# Patient Record
Sex: Male | Born: 1960 | Race: Black or African American | Hispanic: No | Marital: Single | Smoking: Current every day smoker
Health system: Southern US, Community
[De-identification: ages and names within clinical notes are randomized; demographics above are authoritative.]

---

## 2014-09-16 ENCOUNTER — Encounter (HOSPITAL_COMMUNITY): Payer: Self-pay | Admitting: Emergency Medicine

## 2014-09-16 ENCOUNTER — Emergency Department (HOSPITAL_COMMUNITY)
Admission: EM | Admit: 2014-09-16 | Discharge: 2014-09-16 | Payer: Self-pay | Attending: Emergency Medicine | Admitting: Emergency Medicine

## 2014-09-16 DIAGNOSIS — S61205A Unspecified open wound of left ring finger without damage to nail, initial encounter: Secondary | ICD-10-CM | POA: Insufficient documentation

## 2014-09-16 DIAGNOSIS — Z23 Encounter for immunization: Secondary | ICD-10-CM | POA: Insufficient documentation

## 2014-09-16 DIAGNOSIS — S61219A Laceration without foreign body of unspecified finger without damage to nail, initial encounter: Secondary | ICD-10-CM

## 2014-09-16 DIAGNOSIS — Z720 Tobacco use: Secondary | ICD-10-CM | POA: Insufficient documentation

## 2014-09-16 MED ORDER — LIDOCAINE HCL 2 % IJ SOLN
10.0000 mL | Freq: Once | INTRAMUSCULAR | Status: DC
  Filled 2014-09-16: qty 20

## 2014-09-16 MED ORDER — TETANUS-DIPHTH-ACELL PERTUSSIS 5-2.5-18.5 LF-MCG/0.5 IM SUSP
0.5000 mL | Freq: Once | INTRAMUSCULAR | Status: AC
  Administered 2014-09-16: 0.5 mL via INTRAMUSCULAR
  Filled 2014-09-16: qty 0.5
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
## 2014-09-16 NOTE — ED Notes (Signed)
Pt being sutured

## 2014-09-16 NOTE — ED Provider Notes (Signed)
Medical screening examination/treatment/procedure(s) were performed by non-physician practitioner and as supervising physician I was immediately available for consultation/collaboration.   EKG Interpretation None        Layla MawKristen N Ward, DO 09/16/14 [phone]

## 2014-09-16 NOTE — ED Notes (Signed)
PA at bedside.
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
## 2014-09-16 NOTE — Discharge Instructions (Signed)
WOUND CARE °Please have your stitches/staples removed in 8 days or sooner if you have concerns. You may do this at any available urgent care or at your primary care doctor's office. ° Keep area clean and dry for 24 hours. Do not remove °bandage, if applied. ° After 24 hours, remove bandage and wash wound °gently with mild soap and warm water. Reapply °a new bandage after cleaning wound, if directed. ° Continue daily cleansing with soap and water until °stitches/staples are removed. ° Do not apply any ointments or creams to the wound °while stitches/staples are in place, as this may cause °delayed healing. ° Seek medical careif you experience any of the following °signs of infection: Swelling, redness, pus drainage, °streaking, fever >101.0 F ° Seek care if you experience excessive bleeding °that does not stop after 15-20 minutes of constant, firm °pressure. ° ° ° °

## 2019-01-31 ENCOUNTER — Other Ambulatory Visit: Payer: Self-pay

## 2019-01-31 ENCOUNTER — Encounter (HOSPITAL_COMMUNITY): Payer: Self-pay

## 2019-01-31 ENCOUNTER — Inpatient Hospital Stay (HOSPITAL_COMMUNITY)
Admission: AD | Admit: 2019-01-31 | Discharge: 2019-02-05 | DRG: 885 | Disposition: A | Discharge status: Home / Self Care | Payer: Federal, State, Local not specified - Other | Source: Intra-hospital | Attending: Psychiatry | Admitting: Psychiatry

## 2019-01-31 ENCOUNTER — Emergency Department (HOSPITAL_COMMUNITY)
Admission: EM | Admit: 2019-01-31 | Discharge: 2019-01-31 | Disposition: A | Discharge status: Other Acute Inpatient Hospital | Payer: Self-pay | Attending: Emergency Medicine | Admitting: Emergency Medicine

## 2019-01-31 DIAGNOSIS — Y92410 Unspecified street and highway as the place of occurrence of the external cause: Secondary | ICD-10-CM

## 2019-01-31 DIAGNOSIS — Y901 Blood alcohol level of 20-39 mg/100 ml: Secondary | ICD-10-CM | POA: Diagnosis present

## 2019-01-31 DIAGNOSIS — Z789 Other specified health status: Secondary | ICD-10-CM

## 2019-01-31 DIAGNOSIS — Z046 Encounter for general psychiatric examination, requested by authority: Secondary | ICD-10-CM | POA: Insufficient documentation

## 2019-01-31 DIAGNOSIS — G8929 Other chronic pain: Secondary | ICD-10-CM | POA: Diagnosis present

## 2019-01-31 DIAGNOSIS — Z008 Encounter for other general examination: Secondary | ICD-10-CM

## 2019-01-31 DIAGNOSIS — F1410 Cocaine abuse, uncomplicated: Secondary | ICD-10-CM | POA: Diagnosis present

## 2019-01-31 DIAGNOSIS — F1010 Alcohol abuse, uncomplicated: Secondary | ICD-10-CM | POA: Diagnosis present

## 2019-01-31 DIAGNOSIS — F1099 Alcohol use, unspecified with unspecified alcohol-induced disorder: Secondary | ICD-10-CM | POA: Insufficient documentation

## 2019-01-31 DIAGNOSIS — G4700 Insomnia, unspecified: Secondary | ICD-10-CM | POA: Diagnosis present

## 2019-01-31 DIAGNOSIS — F1490 Cocaine use, unspecified, uncomplicated: Secondary | ICD-10-CM | POA: Insufficient documentation

## 2019-01-31 DIAGNOSIS — F1424 Cocaine dependence with cocaine-induced mood disorder: Secondary | ICD-10-CM | POA: Diagnosis not present

## 2019-01-31 DIAGNOSIS — F17210 Nicotine dependence, cigarettes, uncomplicated: Secondary | ICD-10-CM | POA: Diagnosis present

## 2019-01-31 DIAGNOSIS — F1020 Alcohol dependence, uncomplicated: Secondary | ICD-10-CM

## 2019-01-31 DIAGNOSIS — Z7289 Other problems related to lifestyle: Secondary | ICD-10-CM

## 2019-01-31 DIAGNOSIS — R45851 Suicidal ideations: Secondary | ICD-10-CM | POA: Insufficient documentation

## 2019-01-31 DIAGNOSIS — F329 Major depressive disorder, single episode, unspecified: Secondary | ICD-10-CM | POA: Insufficient documentation

## 2019-01-31 DIAGNOSIS — F4310 Post-traumatic stress disorder, unspecified: Secondary | ICD-10-CM | POA: Diagnosis present

## 2019-01-31 DIAGNOSIS — Z720 Tobacco use: Secondary | ICD-10-CM

## 2019-01-31 DIAGNOSIS — R441 Visual hallucinations: Secondary | ICD-10-CM | POA: Diagnosis present

## 2019-01-31 DIAGNOSIS — Z590 Homelessness: Secondary | ICD-10-CM | POA: Diagnosis not present

## 2019-01-31 DIAGNOSIS — F332 Major depressive disorder, recurrent severe without psychotic features: Principal | ICD-10-CM | POA: Diagnosis present

## 2019-01-31 DIAGNOSIS — R030 Elevated blood-pressure reading, without diagnosis of hypertension: Secondary | ICD-10-CM | POA: Insufficient documentation

## 2019-01-31 DIAGNOSIS — F17200 Nicotine dependence, unspecified, uncomplicated: Secondary | ICD-10-CM | POA: Insufficient documentation

## 2019-01-31 LAB — RAPID URINE DRUG SCREEN, HOSP PERFORMED
Amphetamines: NOT DETECTED
Barbiturates: NOT DETECTED
Benzodiazepines: NOT DETECTED
COCAINE: POSITIVE — AB
OPIATES: NOT DETECTED
TETRAHYDROCANNABINOL: NOT DETECTED

## 2019-01-31 LAB — COMPREHENSIVE METABOLIC PANEL
ALBUMIN: 4.1 g/dL (ref 3.5–5.0)
ALT: 22 U/L (ref 0–44)
ANION GAP: 14 (ref 5–15)
AST: 21 U/L (ref 15–41)
Alkaline Phosphatase: 72 U/L (ref 38–126)
BUN: 14 mg/dL (ref 6–20)
CO2: 22 mmol/L (ref 22–32)
Calcium: 8.9 mg/dL (ref 8.9–10.3)
Chloride: 100 mmol/L (ref 98–111)
Creatinine, Ser: 1.15 mg/dL (ref 0.61–1.24)
GFR calc Af Amer: >60 mL/min (ref >60)
GFR calc non Af Amer: >60 mL/min (ref >60)
GLUCOSE: 79 mg/dL (ref 70–99)
POTASSIUM: 3.8 mmol/L (ref 3.5–5.1)
Sodium: 136 mmol/L (ref 135–145)
Total Bilirubin: 0.8 mg/dL (ref 0.3–1.2)
Total Protein: 7.2 g/dL (ref 6.5–8.1)

## 2019-01-31 LAB — ETHANOL: Alcohol, Ethyl (B): 37 mg/dL — ABNORMAL HIGH (ref <10)

## 2019-01-31 LAB — CBC
HEMATOCRIT: 45.2 % (ref 39.0–52.0)
HEMOGLOBIN: 14.5 g/dL (ref 13.0–17.0)
MCH: 29.3 pg (ref 26.0–34.0)
MCHC: 32.1 g/dL (ref 30.0–36.0)
MCV: 91.3 fL (ref 80.0–100.0)
Platelets: 222 10*3/uL (ref 150–400)
RBC: 4.95 MIL/uL (ref 4.22–5.81)
RDW: 14.9 % (ref 11.5–15.5)
WBC: 11 10*3/uL — AB (ref 4.0–10.5)
nRBC: 0 % (ref 0.0–0.2)

## 2019-01-31 LAB — SALICYLATE LEVEL: Salicylate Lvl: <7 mg/dL (ref 2.8–30.0)

## 2019-01-31 LAB — ACETAMINOPHEN LEVEL

## 2019-01-31 MED ORDER — LORAZEPAM 1 MG PO TABS: 0.0000 mg | ORAL_TABLET | Freq: Two times a day (BID) | ORAL | Status: DC

## 2019-01-31 MED ORDER — NICOTINE 21 MG/24HR TD PT24: 21.0000 mg | MEDICATED_PATCH | Freq: Every day | TRANSDERMAL | Status: DC

## 2019-01-31 MED ORDER — ZOLPIDEM TARTRATE 5 MG PO TABS: 5.0000 mg | ORAL_TABLET | Freq: Every evening | ORAL | Status: DC | PRN

## 2019-01-31 MED ORDER — ACETAMINOPHEN 325 MG PO TABS: 650.0000 mg | ORAL_TABLET | ORAL | Status: DC | PRN

## 2019-01-31 MED ORDER — ALUM & MAG HYDROXIDE-SIMETH 200-200-20 MG/5ML PO SUSP: 30.0000 mL | Freq: Four times a day (QID) | ORAL | Status: DC | PRN

## 2019-01-31 MED ORDER — VITAMIN B-1 100 MG PO TABS: 100.0000 mg | ORAL_TABLET | Freq: Every day | ORAL | Status: DC

## 2019-01-31 MED ORDER — LORAZEPAM 2 MG/ML IJ SOLN: 0.0000 mg | Freq: Four times a day (QID) | INTRAMUSCULAR | Status: DC

## 2019-01-31 MED ORDER — LORAZEPAM 2 MG/ML IJ SOLN: 0.0000 mg | Freq: Two times a day (BID) | INTRAMUSCULAR | Status: DC

## 2019-01-31 MED ORDER — ONDANSETRON HCL 4 MG PO TABS: 4.0000 mg | ORAL_TABLET | Freq: Three times a day (TID) | ORAL | Status: DC | PRN

## 2019-01-31 MED ORDER — LORAZEPAM 1 MG PO TABS: 0.0000 mg | ORAL_TABLET | Freq: Four times a day (QID) | ORAL | Status: DC

## 2019-01-31 MED ORDER — THIAMINE HCL 100 MG/ML IJ SOLN: 100.0000 mg | Freq: Every day | INTRAMUSCULAR | Status: DC

## 2019-01-31 NOTE — ED Triage Notes (Signed)
Pt reports increased stress over the past few weeks. Pt reports a relapse of cocaine a few weeks ago. He states that today he was sitting on the railroad tracks debating to jump in front of it when the trained passed. Pt is not medicated. Pt calm and cooperative. Arrives with sister.

## 2019-01-31 NOTE — ED Notes (Signed)
Bed: [facility] Expected date:  Expected time:  Means of arrival:  Comments: 
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
## 2019-01-31 NOTE — ED Provider Notes (Signed)
[facility]-EMERGENCY DEPT Provider Note   CSN: [id number] Arrival date & time: 01/31/19  1658    History   Chief Complaint Chief Complaint  Patient presents with  . Suicidal    HPI    Max Gomez is a 58 y.o. male with a PMHx of high blood pressure without formal diagnosis of HTN, who presents to the ED with complaints of suicidal ideations and increased stress.  Patient states that he has been stressed for the last several months, but today it has been worse.  He states that today he started having suicidal ideations with a plan to jump in front of a train.  He also endorses occasional visual hallucinations seeing shadows.  He endorses drinking alcohol, states that he has a sixpack of beer a day, drank a 40 ounce of beer today.  He endorses using cocaine about 4 to 5 days ago.  He also endorses being a tobacco user.  He is not currently on any medications.  He denies HI or auditory hallucinations.  He has been told in the past that he has high blood pressure but he has not been put on medications.  He has no medical complaints at this time and is here voluntarily.  The history is provided by the patient and medical records. No language interpreter was used.    History reviewed. No pertinent past medical history.  There are no active problems to display for this patient.   History reviewed. No pertinent surgical history.      Home Medications    Prior to Admission medications   Not on File    Family History Family History  Problem Relation Age of Onset  . Cancer Other     Social History Social History   Tobacco Use  . Smoking status: Current Every Day Smoker  Substance Use Topics  . Alcohol use: Yes  . Drug use: No     Allergies   Patient has no known allergies.   Review of Systems Review of Systems  Constitutional: Negative for chills and fever.  Respiratory: Negative for shortness of breath.   Cardiovascular: Negative for chest  pain.  Gastrointestinal: Negative for abdominal pain, constipation, diarrhea, nausea and vomiting.  Genitourinary: Negative for dysuria and hematuria.  Musculoskeletal: Negative for arthralgias and myalgias.  Skin: Negative for color change.  Allergic/Immunologic: Negative for immunocompromised state.  Neurological: Negative for weakness and numbness.  Psychiatric/Behavioral: Positive for hallucinations and suicidal ideas. Negative for confusion.   All other systems reviewed and are negative for acute change except as noted in the HPI.    Physical Exam Updated Vital Signs BP (!) 150/86   Pulse (!) 101   Temp 98.3 F (36.8 C) (Oral)   Resp 16   SpO2 100%    Physical Exam Vitals signs and nursing note reviewed.  Constitutional:      General: He is not in acute distress.    Appearance: Normal appearance. He is well-developed. He is not toxic-appearing.     Comments: Afebrile, nontoxic, NAD  HENT:     Head: Normocephalic and atraumatic.  Eyes:     General:        Right eye: No discharge.        Left eye: No discharge.     Conjunctiva/sclera: Conjunctivae normal.  Neck:     Musculoskeletal: Normal range of motion and neck supple.  Cardiovascular:     Rate and Rhythm: Normal rate and regular rhythm.     Pulses:  Normal pulses.     Heart sounds: Normal heart sounds, S1 normal and S2 normal. No murmur. No friction rub. No gallop.      Comments: No tachycardia on exam Pulmonary:     Effort: Pulmonary effort is normal. No respiratory distress.     Breath sounds: Normal breath sounds. No decreased breath sounds, wheezing, rhonchi or rales.  Abdominal:     General: Bowel sounds are normal. There is no distension.     Palpations: Abdomen is soft. Abdomen is not rigid.     Tenderness: There is no abdominal tenderness. There is no right CVA tenderness, left CVA tenderness, guarding or rebound. Negative signs include Murphy's sign and McBurney's sign.  Musculoskeletal: Normal range of  motion.  Skin:    General: Skin is warm and dry.     Findings: No rash.  Neurological:     Mental Status: He is alert and oriented to person, place, and time.     Sensory: Sensation is intact. No sensory deficit.     Motor: Motor function is intact.  Psychiatric:        Attention and Perception: He perceives visual hallucinations. He does not perceive auditory hallucinations.        Mood and Affect: Affect normal. Mood is depressed.        Behavior: Behavior normal.        Thought Content: Thought content includes suicidal ideation. Thought content does not include homicidal ideation. Thought content includes suicidal plan. Thought content does not include homicidal plan.     Comments: Depressed mood/affect, but pleasant and cooperative. Endorsing SI with a plan, denies HI, reports visual hallucinations without auditory hallucinations, doesn't seem to be responding to internal stimuli.       ED Treatments / Results  Labs (all labs ordered are listed, but only abnormal results are displayed) Labs Reviewed  ETHANOL - Abnormal; Notable for the following components:      Result Value   Alcohol, Ethyl (B) 37 (*)    All other components within normal limits  ACETAMINOPHEN LEVEL - Abnormal; Notable for the following components:   Acetaminophen (Tylenol), Serum <10 (*)    All other components within normal limits  CBC - Abnormal; Notable for the following components:   WBC 11.0 (*)    All other components within normal limits  RAPID URINE DRUG SCREEN, HOSP PERFORMED - Abnormal; Notable for the following components:   Cocaine POSITIVE (*)    All other components within normal limits  COMPREHENSIVE METABOLIC PANEL  SALICYLATE LEVEL    EKG None  Radiology No results found.  Procedures Procedures (including critical care time)  Medications Ordered in ED Medications  acetaminophen (TYLENOL) tablet 650 mg (has no administration in time range)  zolpidem (AMBIEN) tablet 5 mg (has  no administration in time range)  ondansetron (ZOFRAN) tablet 4 mg (has no administration in time range)  alum & mag hydroxide-simeth (MAALOX/MYLANTA) 200-200-20 MG/5ML suspension 30 mL (has no administration in time range)  nicotine (NICODERM CQ - dosed in mg/24 hours) patch 21 mg (has no administration in time range)  LORazepam (ATIVAN) injection 0-4 mg (has no administration in time range)    Or  LORazepam (ATIVAN) tablet 0-4 mg (has no administration in time range)  LORazepam (ATIVAN) injection 0-4 mg (has no administration in time range)    Or  LORazepam (ATIVAN) tablet 0-4 mg (has no administration in time range)  thiamine (VITAMIN B-1) tablet 100 mg (has no administration in time range)  Or  thiamine (B-1) injection 100 mg (has no administration in time range)     Initial Impression / Assessment and Plan / ED Course  I have reviewed the triage vital signs and the nursing notes.  Pertinent labs & imaging results that were available during my care of the patient were reviewed by me and considered in my medical decision making (see chart for details).        58 y.o. male here with suicidal ideations with a plan to jump in front of a train.  Also endorses visual hallucinations seeing shadows.  No HI or auditory hallucinations.  Endorses drinking alcohol regularly, including a 40 ounce beer today.  Uses cocaine, last use 4 to 5 days ago.  Tobacco user as well.  Cessation of all of these were advised.  He is not currently on any medications.  Has been told in the past that he has high blood pressure, but is not on anything.  On exam, BP 150/86, appears depressed, but otherwise physical exam benign.  We will get psych clearance labs and TTS consult.  Will reassess shortly.  7:48 PM CBC with marginally elevated WBC 11.0 but otherwise WNL, doubt clinical significance. CMP WNL. EtOH level 37. Salicylate and acetaminophen levels WNL. UDS +cocaine. Pt medically cleared at this time. Psych  hold orders and CIWA med orders placed. Please see TTS notes for further documentation of care/dispo. PLEASE NOTE THAT PT IS HERE VOLUNTARILY AT THIS TIME, IF PT TRIES TO LEAVE THEY WOULD NEED IVC PAPERWORK TAKEN OUT. Pt stable at time of med clearance.     Final Clinical Impressions(s) / ED Diagnoses   Final diagnoses:  Suicidal ideation  Visual hallucinations  Alcohol use  Tobacco use  Cocaine use  Medical clearance for psychiatric admission    ED Discharge Orders    [address] [place], [place] 01/31/19 1949    Mancel Bale, MD 01/31/19 [phone]

## 2019-01-31 NOTE — Progress Notes (Signed)
Pt accepted to [facility] at 23:00 to 304-2 to Dr. Jama Flavors, MD. Call to report [phone]. Jari Favre, RN has been advised.  VOL consent for treatment signed and faxed to [facility] by previous TTS counselor.   Princess Bruins, MSW, LCSW Therapeutic Triage Specialist  [phone]

## 2019-01-31 NOTE — ED Notes (Signed)
Bed: WA26 Expected date:  Expected time:  Means of arrival:  Comments: TR4 

## 2019-02-01 ENCOUNTER — Encounter (HOSPITAL_COMMUNITY): Payer: Self-pay

## 2019-02-01 DIAGNOSIS — F1424 Cocaine dependence with cocaine-induced mood disorder: Secondary | ICD-10-CM

## 2019-02-01 DIAGNOSIS — F332 Major depressive disorder, recurrent severe without psychotic features: Secondary | ICD-10-CM | POA: Diagnosis present

## 2019-02-01 LAB — HEMOGLOBIN A1C
Hgb A1c MFr Bld: 5.7 % — ABNORMAL HIGH (ref 4.8–5.6)
MEAN PLASMA GLUCOSE: 116.89 mg/dL

## 2019-02-01 LAB — LIPID PANEL
CHOLESTEROL: 238 mg/dL — AB (ref 0–200)
HDL: 90 mg/dL (ref >40)
LDL Cholesterol: 131 mg/dL — ABNORMAL HIGH (ref 0–99)
Total CHOL/HDL Ratio: 2.6 RATIO
Triglycerides: 84 mg/dL (ref <150)
VLDL: 17 mg/dL (ref 0–40)

## 2019-02-01 LAB — TSH: TSH: 4.315 u[IU]/mL (ref 0.350–4.500)

## 2019-02-01 MED ORDER — ONDANSETRON 4 MG PO TBDP: 4.0000 mg | ORAL_TABLET | Freq: Four times a day (QID) | ORAL | Status: DC | PRN

## 2019-02-01 MED ORDER — LOPERAMIDE HCL 2 MG PO CAPS: 2.0000 mg | ORAL_CAPSULE | ORAL | Status: AC | PRN

## 2019-02-01 MED ORDER — HYDROXYZINE HCL 25 MG PO TABS: 25.0000 mg | ORAL_TABLET | Freq: Three times a day (TID) | ORAL | Status: DC | PRN

## 2019-02-01 MED ORDER — TRAZODONE HCL 50 MG PO TABS
50.0000 mg | ORAL_TABLET | Freq: Every evening | ORAL | Status: DC | PRN
  Administered 2019-02-02 – 2019-02-04 (×3): 50 mg via ORAL
  Filled 2019-02-01: qty 7
  Filled 2019-02-01 (×4): qty 1

## 2019-02-01 MED ORDER — ADULT MULTIVITAMIN W/MINERALS CH
1.0000 | ORAL_TABLET | Freq: Every day | ORAL | Status: DC
  Administered 2019-02-01 – 2019-02-05 (×5): 1 via ORAL
  Filled 2019-02-01 (×8): qty 1

## 2019-02-01 MED ORDER — ARIPIPRAZOLE 2 MG PO TABS
2.0000 mg | ORAL_TABLET | Freq: Every day | ORAL | Status: DC
  Administered 2019-02-01 – 2019-02-05 (×5): 2 mg via ORAL
  Filled 2019-02-01 (×4): qty 1
  Filled 2019-02-01: qty 7
  Filled 2019-02-01 (×3): qty 1
  Filled 2019-02-01: qty 7
  Filled 2019-02-01: qty 1

## 2019-02-01 MED ORDER — NICOTINE 21 MG/24HR TD PT24
21.0000 mg | MEDICATED_PATCH | Freq: Every day | TRANSDERMAL | Status: DC
  Administered 2019-02-05: 21 mg via TRANSDERMAL
  Filled 2019-02-01 (×6): qty 1

## 2019-02-01 MED ORDER — ALUM & MAG HYDROXIDE-SIMETH 200-200-20 MG/5ML PO SUSP: 30.0000 mL | ORAL | Status: DC | PRN

## 2019-02-01 MED ORDER — SERTRALINE HCL 50 MG PO TABS
50.0000 mg | ORAL_TABLET | Freq: Every day | ORAL | Status: DC
  Administered 2019-02-01 – 2019-02-05 (×5): 50 mg via ORAL
  Filled 2019-02-01: qty 7
  Filled 2019-02-01 (×8): qty 1
  Filled 2019-02-01: qty 7

## 2019-02-01 MED ORDER — MAGNESIUM HYDROXIDE 400 MG/5ML PO SUSP: 30.0000 mL | Freq: Every day | ORAL | Status: DC | PRN

## 2019-02-01 MED ORDER — CHLORDIAZEPOXIDE HCL 25 MG PO CAPS: 25.0000 mg | ORAL_CAPSULE | Freq: Four times a day (QID) | ORAL | Status: DC | PRN

## 2019-02-01 MED ORDER — ACETAMINOPHEN 325 MG PO TABS: 650.0000 mg | ORAL_TABLET | Freq: Four times a day (QID) | ORAL | Status: DC | PRN

## 2019-02-01 MED ORDER — ONDANSETRON 4 MG PO TBDP: 4.0000 mg | ORAL_TABLET | Freq: Four times a day (QID) | ORAL | Status: AC | PRN

## 2019-02-01 MED ORDER — VITAMIN B-1 100 MG PO TABS
100.0000 mg | ORAL_TABLET | Freq: Every day | ORAL | Status: DC
  Administered 2019-02-02 – 2019-02-05 (×4): 100 mg via ORAL
  Filled 2019-02-01 (×6): qty 1

## 2019-02-01 MED ORDER — LOPERAMIDE HCL 2 MG PO CAPS: 2.0000 mg | ORAL_CAPSULE | ORAL | Status: DC | PRN

## 2019-02-01 MED ORDER — HYDROXYZINE HCL 25 MG PO TABS
25.0000 mg | ORAL_TABLET | Freq: Four times a day (QID) | ORAL | Status: AC | PRN
  Administered 2019-02-02: 25 mg via ORAL
  Filled 2019-02-01: qty 1

## 2019-02-01 NOTE — Progress Notes (Signed)
CSW met with patient to discuss aftercare referrals. Patient stated he left his job because he planned to enter a long term treatment facility.  Patient has several upcoming court dates. [facility] 03/12 and 03/23. [facility] in 03/23.   CSW able to refer patient to [facility], CSW discussed the likeliness of the patient being able to discharge straight to [facility] being unlikely. Patient anxious, but appropriate. CSW provided patient a list of [facility] as a back up.  Stephanie Acre, LCSW-A Clinical Social Worker
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
## 2019-02-01 NOTE — Progress Notes (Signed)
Patient attended grief and loss group facilitated by Burnis Kingfisher, Mdiv, [facility], and Ethel Rana, MS, [facility], [facility].   Group focuses on change and loss experienced by group members; topics include loss due to death, loss of relationships, loss of a place, loss of sense of self, etc. Group members are invited to share the changes and losses that are currently affecting their lives. Facilitators tie together shared experiences between group members and validate emotions felt by participants.  Pt Max Gomez attended and participated in group discussion. Pt acknowledged that grief can be difficult and can come in ways we don't expect. Pt reported he is trying to hold onto perspective that in intense moments, he can become overwhelmed and struggle to find purpose in life, but that due to his faith he's been able to hold onto hope during difficult times. Pt reported he tries to be good to others and tries to control what he can, as opposed to holding grudges. Pt acknowledged his time being incarcerated, the guilt he feels about not being there for his mother due to his incarceration, and how leaving prison he returned to substance use. He reported being confused by how he could return to use after 10 years of sobriety in prison. He praised other group members for admitting to their problems, whereas he reports he lies about his use in order to protect others. Pt appeared unaware of group norms, as evident by speaking over others, but over time in group he began listening to other group members and reflecting on what they shared.   Ethel Rana, MS, [facility], [facility]
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
## 2019-02-01 NOTE — Progress Notes (Addendum)
D:  Patient's self inventory sheet, patient has fair sleep, no sleep medication.  Good appetite, low energy level, poor concentration.  Rated depression 8, hopeless 5, anxiety 7.  Physical problems, lightheaded, headaches.  Denied physical pain.  Goal is work on depression.  Plans to think.  No discharge plans. A:  No medications scheduled for patient today, and patient stated he did not need any medications.  Emotional support and encouragement given patient. R:  Denied SI and HI, contracts for safety.  Denied A/V hallucinations.  Safety maintained with 15 minute checks. Patient stated he has had a very hard life.  Does not understand why bad things keep happening to him.

## 2019-02-01 NOTE — [facility] Suicide Risk Assessment (Signed)
[facility] INPATIENT:  Family/Significant Other Suicide Prevention Education  Suicide Prevention Education:  Contact Attempts: Max Gomez, Niece, [phone]  has been identified by the patient as the family member/significant other with whom the patient will be residing, and identified as the person(s) who will aid the patient in the event of a mental health crisis.  With written consent from the patient, two attempts were made to provide suicide prevention education, prior to and/or following the patient's discharge.  We were unsuccessful in providing suicide prevention education.  A suicide education pamphlet was given to the patient to share with family/significant other.  Date and time of first attempt: 02/01/2019 at 3:54pm, Left a HIPAA compliant VM.  Date and time of second attempt: To be attempted at a later time.  Max Gomez 02/01/2019, 3:55 PM

## 2019-02-01 NOTE — [facility] Group Notes (Signed)
LCSW Group Therapy Notes 02/01/2019 2:48 PM  Type of Therapy and Topic: Group Therapy: Overcoming Obstacles  Participation Level: Active  Description of Group:  In this group patients will be encouraged to explore what they see as obstacles to their own wellness and recovery. They will be guided to discuss their thoughts, feelings, and behaviors related to these obstacles. The group will process together ways to cope with barriers, with attention given to specific choices patients can make. Each patient will be challenged to identify changes they are motivated to make in order to overcome their obstacles. This group will be process-oriented, with patients participating in exploration of their own experiences as well as giving and receiving support and challenge from other group members.  Therapeutic Goals: 1. Patient will identify personal and current obstacles as they relate to admission. 2. Patient will identify barriers that currently interfere with their wellness or overcoming obstacles.  3. Patient will identify feelings, thought process and behaviors related to these barriers. 4. Patient will identify two changes they are willing to make to overcome these obstacles:   Summary of Patient Progress Patient stated suicidal thoughts and alcohol use are obstacles he experienced prior to admission. Patient discussed that trying to get into long term residential treatment is a current obstacle he is facing. He reports he feels depressed and anxious knowing he may not be able to discharge directly to a residential treatment facility. He shares he feels like he has always cared more for others than he cares for himself and he has experienced a significant amount of grief and loss in the last few years.    Therapeutic Modalities:  Cognitive Behavioral Therapy Solution Focused Therapy Motivational Interviewing Relapse Prevention Therapy  Enid Cutter, MSW, [facility] 02/01/2019 2:48 PM
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
## 2019-02-01 NOTE — Progress Notes (Signed)
Recreation Therapy Notes  Date:  2. 24. 20 Time: 0930 Location: 300 [address] Dayroom  Group Topic: Stress Management  Goal Area(s) Addresses:  Patient will identify positive stress management techniques. Patient will identify benefits of using stress management post d/c.  Intervention: Worksheet  Activity :  Choice Meditation.  LRT introduced the stress management technique of meditation.  LRT played a meditation that focused on having a choice in changing the way they think about things.  Patients were to follow along as meditation played to engage in activity.  Education:  Stress Management, Discharge Planning.   Education Outcome: Acknowledges Education  Clinical Observations/Feedback: Pt did not attend group.     Jadyn Brasher, LRT/CTRS         Latreece Mochizuki A 02/01/2019 10:54 AM 

## 2019-02-01 NOTE — Plan of Care (Signed)
Nurse discussed anxiety, depression and coping skills with patient.  

## 2019-02-01 NOTE — Progress Notes (Signed)
Pt invited, but declined AA group this evening.  
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
## 2019-02-02 MED ORDER — GABAPENTIN 100 MG PO CAPS
200.0000 mg | ORAL_CAPSULE | Freq: Three times a day (TID) | ORAL | Status: DC
  Administered 2019-02-02 – 2019-02-05 (×10): 200 mg via ORAL
  Filled 2019-02-02: qty 2
  Filled 2019-02-02: qty 42
  Filled 2019-02-02 (×2): qty 2
  Filled 2019-02-02: qty 42
  Filled 2019-02-02 (×4): qty 2
  Filled 2019-02-02 (×3): qty 42
  Filled 2019-02-02: qty 2
  Filled 2019-02-02: qty 42
  Filled 2019-02-02 (×4): qty 2

## 2019-02-02 NOTE — BHH Group Notes (Signed)
[facility] Group Notes: Nursing Psychoeducation  Date:  02/02/2019  Time:  4:00 PM  Type of Therapy:  Psychoeducational Skills  Participation Level:  Did Not Attend  Summary of Progress/Problems: Patient was invited but declined to attend group.  Durwin Davisson A Delbert Darley 02/02/2019, 5:00 PM 

## 2019-02-02 NOTE — Plan of Care (Signed)
  Problem: Activity: Goal: Interest or engagement in leisure activities will improve Outcome: Progressing Goal: Imbalance in normal sleep/wake cycle will improve Outcome: Progressing   

## 2019-02-02 NOTE — Progress Notes (Signed)
Pt attend wrap up group. 

## 2019-02-02 NOTE — Progress Notes (Signed)
D: Pt was in dayroom upon initial approach.  Pt presents with depressed affect and mood.  He reports his day has been "all right, been relaxing, sleeping."  His goal is to "not think about the things I was thinking about the day I came in here."  Pt denies SI/HI, denies hallucinations, denies pain.  Pt has been visible in milieu interacting with peers and staff appropriately.  Pt attended evening group.    A: Introduced self to pt.  Met with pt 1:1.  Actively listened to pt and offered support and encouragement.  PRN medication administered for sleep.  Q15 minute safety checks maintained.  R: Pt is safe on the unit.  Pt is compliant with medications.  Pt verbally contracts for safety.  Will continue to monitor and assess.

## 2019-02-02 NOTE — Progress Notes (Signed)
Recreation Therapy Notes  Animal-Assisted Activity (AAA) Program Checklist/Progress Notes Patient Eligibility Criteria Checklist & Daily Group note for Rec Tx Intervention  Date: 2.25.20 Time: 1430 Location: 400 Hall Dayroom   AAA/T Program Assumption of Risk Form signed by Patient/ or Parent Legal Guardian  YES   Patient is free of allergies or sever asthma  YES   Patient reports no fear of animals  YES   Patient reports no history of cruelty to animals  YES   Patient understands his/her participation is voluntary  YES   Patient washes hands before animal contact  YES   Patient washes hands after animal contact  YES   Behavioral Response:  Engaged  Education: Hand Washing, Appropriate Animal Interaction   Education Outcome: Acknowledges understanding/In group clarification offered/Needs additional education.   Clinical Observations/Feedback:  Pt attended and participated in activity.    Max Gomez, LRT/CTRS         Max Gomez 02/02/2019 3:09 PM 

## 2019-02-02 NOTE — Progress Notes (Signed)
D:  Patient's self inventory sheet, patient has fair sleep, no sleep medication.  Good appetite, low energy level, good concentration.  Rated depression and anxiety 5, hopeless 4.  Denied withdrawals.  Denied SI.  Physical problems, lightheaded, headaches.  Denied physical pain.  Goal is stay hopefull.  Plans to have better thoughts.  No discharge plans. A:  Medications administered per MD orders.  Emotional support and encouragement given patient. R:  Denied SI and HI, contracts for safety.  Denied A/V hallucinations.  Safety maintained with 15 minute checks.
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
## 2019-02-02 NOTE — [facility] Suicide Risk Assessment (Signed)
[facility] INPATIENT:  Family/Significant Other Suicide Prevention Education  Suicide Prevention Education:  Contact Attempts: Peterjohn Stavig, Niece, [phone]  has been identified by the patient as the family member/significant other with whom the patient will be residing, and identified as the person(s) who will aid the patient in the event of a mental health crisis.  With written consent from the patient, two attempts were made to provide suicide prevention education, prior to and/or following the patient's discharge.  We were unsuccessful in providing suicide prevention education.  A suicide education pamphlet was given to the patient to share with family/significant other.  Date and time of first attempt: 02/01/2019 at 3:54pm, Left a HIPAA compliant VM.  Date and time of second attempt: 01/13/2019 at 11:15am.  Darreld Mclean 02/02/2019, 11:17 AM

## 2019-02-02 NOTE — Progress Notes (Signed)
CSW met with patient on unit to discuss referrals and discharge planning. Patient reports that he feels slightly better and has a more stabilized mood with medication, but he feels a bit drowsy. CSW encouraged patient to share his concerns his psychiatrist.  Patient has been referred to [facility]. He was upcoming court dates in March related to driving with a suspended license. These court dates present a barrier to the patient being referred to other residential treatment providers.  CSW presented patient with a list of [employer] yesterday, patient reports he has not followed up with any yet as he spoke with his sister, and she is reportedly working on securing temporary housing for the patient.  Patient agreeable to outpatient follow up for medication management; he prefers [facility] as his provider. He will use their walk in hours after discharge.  Stephanie Acre, LCSW-A Clinical Social Worker

## 2019-02-02 NOTE — Plan of Care (Signed)
Nurse discussed anxiety, depression, coping skills with patient. 

## 2019-02-02 NOTE — [facility] Group Notes (Signed)
[facility] Group Therapy 02/02/2019 3:34 PM  Type of Therapy: Mental Health Association Presentation  Participation Level: Active  Participation Quality: Attentive  Affect: Appropriate  Cognitive: Oriented  Insight: Developing/Improving  Engagement in Therapy: Engaged  Modes of Intervention: Discussion, Education and Socialization   Summary of Progress/Problems: Mental Health Association (MHA) Speaker came to talk about his personal journey with mental health. The pt processed ways by which to relate to the speaker. MHA speaker provided handouts and educational information pertaining to groups and services offered by the MHA. Pt was engaged in speaker's presentation and was receptive to resources provided.   [name], MSW, LCSWA 02/02/2019 3:34 PM 
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
## 2019-02-02 NOTE — Progress Notes (Signed)
D:  Max Gomez was in his room all shift.  RN approached him while resting in his bed for assessment.  He denied SI/HI or A/V hallucinations.  "I'm just tired and I don't want to go to group tonight."  He denied any pain or discomfort and appeared to be in no physical distress.  He denied any withdrawal symptoms.  He was irritable later when tech came to get his vital signs, which he refused.  He did not request prn medications at bedtime.  He is currently resting with his eyes closed and appears to be asleep. A:  1:1 with RN for support and encouragement.  Medications as ordered.  Q 15 minute checks maintained for safety.  Encouraged participation in group and unit activities.   R:  Max Gomez remains safe on the unit.  We will continue to monitor the progress towards his goals.

## 2019-02-03 NOTE — Plan of Care (Addendum)
Patient self inventory- Patient slept dair last night, sleep medication was requested and was helpful. Appetite is good, energy level normal, concentration good. Depression, hopelessness, and anxiety rated 6, 3, 3, out of 10. Denies withdrawal symptoms. Denies SI HI AVH. Denies physical pain. Patient's goal is to "open up, let things out." Patient is compliant with medications prescribed per provider. No side effects noted. Safety is maintained with 15 minute checks as well as environmental checks. Will continue to monitor and provide support.  Problem: Education: Goal: Understanding of discharge needs will improve Outcome: Progressing   Problem: Health Behavior/Discharge Planning: Goal: Ability to identify changes in lifestyle to reduce recurrence of condition will improve Outcome: Progressing Goal: Identification of resources available to assist in meeting health care needs will improve Outcome: Progressing   Problem: Education: Goal: Knowledge of [facility] Education information/materials will improve Outcome: Progressing

## 2019-02-03 NOTE — Progress Notes (Signed)
CSW met with patient on unit to discuss discharge planning with patient. Patient's [facility] referral has been accepted, but there is not an intake date or projected date at this time; it would likely be at least one week out. Patient reports he will follow up with [facility] during their walk in hours.  He has not followed up with [facility], as he hopes to stay with his sister. He is going to talk to his sister this evening. CSW asked patient to prepare for discharge Friday and to reach out to family supports this evening. Patient agreeable.   Stephanie Acre, LCSW-A Clinical Social Worker
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
## 2019-02-03 NOTE — Progress Notes (Signed)
The patient expressed in group that he had a "pretty fair" day. He verbalized that playing kickball helped to distract him from his problems. He states that he is feeling better in that he is feeling "more focused" and that he understands that he needs to move forward and continue working on his issues. His goal for tomorrow is to discuss his discharge plans with the staff.

## 2019-02-03 NOTE — Progress Notes (Signed)
D: Pt was in dayroom upon initial approach.  Pt presents with depressed affect and mood.  He reports his day was "all right."  His goal is to "not be depressed and stressed" and he reports he is feeling "all right."  Pt denies SI/HI, denies hallucinations, denies pain.  Pt has been visible in milieu interacting with peers and staff appropriately.  Pt seen smiling at times and laughing.  Pt attended evening group.    A: Introduced self to pt.  Met with pt 1:1.  Actively listened to pt and offered support and encouragement.  PRN medication administered for sleep.  Q15 minute safety checks maintained.  R: Pt is safe on the unit.  Pt is compliant with medication.  Pt verbally contracts for safety.  Will continue to monitor and assess.

## 2019-02-03 NOTE — Progress Notes (Signed)
Recreation Therapy Notes  Date:  2.26.20 Time: 0930 Location: [address] Dayroom  Group Topic: Stress Management  Goal Area(s) Addresses:  Patient will identify positive stress management techniques. Patient will identify benefits of using stress management post d/c.  Intervention:  Stress Management    Activity :  Guided Imagery.  LRT introduced the stress management technique of guided imagery.  LRT read a script that focused on relaxing under the summer clouds.  Patients were to listen in order to engage as script was read.    Education:  Stress Management, Discharge Planning.   Education Outcome: Acknowledges Education  Clinical Observations/Feedback:  Pt did not attend group.     Caroll Rancher,  LRT/CTRS         Caroll Rancher A 02/03/2019 11:31 AM

## 2019-02-03 NOTE — Therapy (Signed)
Occupational Therapy Group Note  Date:  02/03/2019 Time:  11:39 AM  Group Topic/Focus:  Sleep Hygiene  Participation Level:  Active  Participation Quality:  Appropriate  Affect:  Blunted  Cognitive:  Appropriate  Insight: Improving  Engagement in Group:  Engaged  Modes of Intervention:  Activity, Discussion, Education and Socialization  Additional Comments:    S: "I have been sleeping better since coming here"  O: Education given on sleep hygiene this date to help pt improve BADL routine. Pt given worksheet to help facilitate understanding and carryover. Pt asked to choose preferred sleep hygiene strategy to implement this date and share insight on current practices.   A: Pt presents to group with blunted affect, engaged and participatory throughout entirety of session. Pt shares that his sleep has been better since coming here. Once continuing education, pt became less engaged and did not offer any strategies to implement. He was accepting to learn relaxation strategies at end of session.   P: OT tx will be 1x per week while pt inpatient.  Dalphine Handing, MSOT, OTR/L Behavioral Health OT/ Acute Relief OT PHP Office: [phone]  Dalphine Handing 02/03/2019, 11:39 AM

## 2019-02-04 NOTE — Plan of Care (Signed)
Nurse discussed anxiety, depression and coping skills with patient.  

## 2019-02-04 NOTE — Progress Notes (Signed)
At the patient's request, CSW attempted to reach patient's sister twice to share information about his [facility] intake appointment and to inquire if she is able to transport him on Sunday, 03/01. Left a voicemail with callback information.  Omar Person, sister, [phone]  Enid Cutter, LCSW-A Clinical Social Worker

## 2019-02-04 NOTE — Progress Notes (Signed)
D:  Patient's self inventory sheet, patient sleeps good, sleep medication helpful.  Good appetite, normal energy level, good concentration.  Rated depression 4, hopeless 5, anxiety 3.  Denied withdrawals.  Denied SI.  Denied physical problems.  Denied physical pain.  Goal is get a home plan.  Plans to call and talk to his sister.  Does have discharge plans. A:  Medications administered per MD orders.  Emotional support and encouragement given patient. R:  Denied SI and HI, contracts for safety.  Denied A/V hallucinations.  Safety maintained with 15 minute checks.
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
## 2019-02-04 NOTE — BHH Group Notes (Signed)
[facility] LCSW Group Therapy Note  Date/Time 02/04/2019 1:15 PM  Type of Therapy/Topic:  Group Therapy:  Feelings about Diagnosis  Participation Level:  Did Not Attend    Marian Sorrow, MSW Intern 02/04/2019 3:45 PM

## 2019-02-04 NOTE — Progress Notes (Signed)
[facility] MD Progress Note  02/04/2019 12:01 PM Max Gomez  MRN:  [id number] Subjective:  Patient is a 58 year old male with a past psychiatric history significant for alcohol dependence, cocaine use disorder and major depression who was admitted on 02/01/2019 with suicidal ideation.  Objective: Patient is seen and examined.  Patient is a 58 year old male with the above-stated past psychiatric history who is seen in follow-up.  He is doing well.  His mood is improved during the course the hospitalization.  He is still a bit anxious, but happy that he has been accepted into a rehabilitation program.  He will be able to go to [facility] for [facility].  It just depends on which facility has the opening at the earliest date.  His vital signs are stable, he is afebrile.  He slept 6 hours last night.  His last CIWA was on 2/23 and was 1.  He denied any suicidal or homicidal ideation.  No evidence of any psychosis.  He denied any side effects to his current medications.  Principal Problem: <principal problem not specified> Diagnosis: Active Problems:   Severe recurrent major depression without psychotic features (HCC)  Total Time spent with patient: 15 minutes  Past Psychiatric History: See admission H&P  Past Medical History: History reviewed. No pertinent past medical history. History reviewed. No pertinent surgical history. Family History:  Family History  Problem Relation Age of Onset  . Cancer Other    Family Psychiatric  History: See admission H&P Social History:  Social History   Substance and Sexual Activity  Alcohol Use Yes     Social History   Substance and Sexual Activity  Drug Use No    Social History   Socioeconomic History  . Marital status: Single    Spouse name: Not on file  . Number of children: Not on file  . Years of education: Not on file  . Highest education level: Not on file  Occupational History  . Not on file  Social Needs  . Financial resource strain: Not on file  .  Food insecurity:    Worry: Not on file    Inability: Not on file  . Transportation needs:    Medical: Not on file    Non-medical: Not on file  Tobacco Use  . Smoking status: Current Every Day Smoker  . Smokeless tobacco: Never Used  Substance and Sexual Activity  . Alcohol use: Yes  . Drug use: No  . Sexual activity: Not on file  Lifestyle  . Physical activity:    Days per week: Not on file    Minutes per session: Not on file  . Stress: Not on file  Relationships  . Social connections:    Talks on phone: Not on file    Gets together: Not on file    Attends religious service: Not on file    Active member of club or organization: Not on file    Attends meetings of clubs or organizations: Not on file    Relationship status: Not on file  Other Topics Concern  . Not on file  Social History Narrative  . Not on file   Additional Social History:    Pain Medications: see MAR Prescriptions: see MAR Over the Counter: see MAR History of alcohol / drug use?: Yes Negative Consequences of Use: Legal, Personal relationships, Financial Withdrawal Symptoms: Patient aware of relationship between substance abuse and physical/medical complications  Sleep: Good  Appetite:  Good  Current Medications: Current Facility-Administered Medications  Medication Dose Route Frequency Provider Last Rate Last Dose  . acetaminophen (TYLENOL) tablet 650 mg  650 mg Oral Q6H PRN Nira Conn A, NP      . alum & mag hydroxide-simeth (MAALOX/MYLANTA) 200-200-20 MG/5ML suspension 30 mL  30 mL Oral Q4H PRN Nira Conn A, NP      . ARIPiprazole (ABILIFY) tablet 2 mg  2 mg Oral Daily Cobos, Rockey Situ, MD   2 mg at 02/04/19 0849  . chlordiazePOXIDE (LIBRIUM) capsule 25 mg  25 mg Oral Q6H PRN Cobos, Fernando A, MD      . gabapentin (NEURONTIN) capsule 200 mg  200 mg Oral TID Antonieta Pert, MD   200 mg at 02/04/19 0849  . hydrOXYzine (ATARAX/VISTARIL) tablet 25 mg  25 mg Oral  Q6H PRN Cobos, Rockey Situ, MD   25 mg at 02/02/19 1153  . loperamide (IMODIUM) capsule 2-4 mg  2-4 mg Oral PRN Cobos, Rockey Situ, MD      . magnesium hydroxide (MILK OF MAGNESIA) suspension 30 mL  30 mL Oral Daily PRN Nira Conn A, NP      . multivitamin with minerals tablet 1 tablet  1 tablet Oral Daily Cobos, Rockey Situ, MD   1 tablet at 02/04/19 0850  . nicotine (NICODERM CQ - dosed in mg/24 hours) patch 21 mg  21 mg Transdermal Daily Cobos, Fernando A, MD      . ondansetron (ZOFRAN-ODT) disintegrating tablet 4 mg  4 mg Oral Q6H PRN Cobos, Rockey Situ, MD      . sertraline (ZOLOFT) tablet 50 mg  50 mg Oral Daily Cobos, Rockey Situ, MD   50 mg at 02/04/19 0850  . thiamine (VITAMIN B-1) tablet 100 mg  100 mg Oral Daily Cobos, Rockey Situ, MD   100 mg at 02/04/19 0850  . traZODone (DESYREL) tablet 50 mg  50 mg Oral QHS PRN Jackelyn Poling, NP   50 mg at 02/03/19 2107    Lab Results: No results found for this or any previous visit (from the past 48 hour(s)).  Blood Alcohol level:  Lab Results  Component Value Date   ETH 37 (H) 01/31/2019    Metabolic Disorder Labs: Lab Results  Component Value Date   HGBA1C 5.7 (H) 02/01/2019   MPG 116.89 02/01/2019   No results found for: PROLACTIN Lab Results  Component Value Date   CHOL 238 (H) 02/01/2019   TRIG 84 02/01/2019   HDL 90 02/01/2019   CHOLHDL 2.6 02/01/2019   VLDL 17 02/01/2019   LDLCALC 131 (H) 02/01/2019    Physical Findings: AIMS: Facial and Oral Movements Muscles of Facial Expression: None, normal Lips and Perioral Area: None, normal Jaw: None, normal Tongue: None, normal,Extremity Movements Upper (arms, wrists, hands, fingers): None, normal Lower (legs, knees, ankles, toes): None, normal, Trunk Movements Neck, shoulders, hips: None, normal, Overall Severity Severity of abnormal movements (highest score from questions above): None, normal Incapacitation due to abnormal movements: None, normal Patient's awareness of  abnormal movements (rate only patient's report): No Awareness, Dental Status Current problems with teeth and/or dentures?: No Does patient usually wear dentures?: No  CIWA:  CIWA-Ar Total: 1 COWS:  COWS Total Score: 1  Musculoskeletal: Strength & Muscle Tone: within normal limits Gait & Station: normal Patient leans: N/A  Psychiatric Specialty Exam: Physical Exam  Nursing note and vitals reviewed. Constitutional: He is oriented to person, place, and time. He appears  well-developed and well-nourished.  HENT:  Head: Normocephalic and atraumatic.  Respiratory: Effort normal.  Neurological: He is alert and oriented to person, place, and time.    ROS  Blood pressure 117/74, pulse 68, temperature 98 F (36.7 C), temperature source Oral, resp. rate 16, height 5\' 8"  (1.727 m), weight 60.8 kg, SpO2 100 %.Body mass index is 20.37 kg/m.  General Appearance: Casual  Eye Contact:  Good  Speech:  Normal Rate  Volume:  Normal  Mood:  Anxious  Affect:  Congruent  Thought Process:  Coherent and Descriptions of Associations: Intact  Orientation:  Full (Time, Place, and Person)  Thought Content:  Logical  Suicidal Thoughts:  No  Homicidal Thoughts:  No  Memory:  Immediate;   Fair Recent;   Fair Remote;   Fair  Judgement:  Intact  Insight:  Fair  Psychomotor Activity:  Increased  Concentration:  Concentration: Fair and Attention Span: Fair  Recall:  [employer] of Knowledge:  Fair  Language:  Fair  Akathisia:  Negative  Handed:  Right  AIMS (if indicated):     Assets:  Communication Skills Desire for Improvement Physical Health Resilience Social Support  ADL's:  Intact  Cognition:  WNL  Sleep:  Number of Hours: 6     Treatment Plan Summary: Daily contact with patient to assess and evaluate symptoms and progress in treatment, Medication management and Plan : Patient is seen and examined.  Patient is a 58 year old male with the above-stated past psychiatric history who is seen in  follow-up.  Patient is doing well.  He is having no problems.  We will plan on discharge tomorrow with probable entry into a rehabilitation program within 1 to 2 weeks.  No change in his current medications. 1.  Continue Abilify 2 mg p.o. daily for mood stability. 2.  Stop Librium. 3.  Continue gabapentin 200 mg p.o. 3 times daily for chronic pain and mood stability. 4.  Continue hydroxyzine 25 mg every 6 hours as needed anxiety. 5.  Continue sertraline 50 mg p.o. daily for anxiety and depression. 6.  Continue thiamine 100 mg p.o. daily for nutritional supplementation. 7.  Continue multivitamin 1 tablet p.o. daily for nutritional supplementation. 8.  Continue trazodone 50 mg p.o. nightly as needed insomnia. 9.  Disposition planning-probable discharge tomorrow 2/28.  Antonieta Pert, MD 02/04/2019, 12:01 PM

## 2019-02-04 NOTE — Progress Notes (Signed)
CSW spoke with [facility] admissions. They are able to accept the patient on Sunday, 02/07/2019 at 10am for intake.   CSW to update patient and inquire if his sister is agreeable to transport the patient.  Enid Cutter, LCSW-A Clinical Social Worker

## 2019-02-05 DIAGNOSIS — F1010 Alcohol abuse, uncomplicated: Secondary | ICD-10-CM

## 2019-02-05 DIAGNOSIS — F1410 Cocaine abuse, uncomplicated: Secondary | ICD-10-CM

## 2019-02-05 DIAGNOSIS — F1020 Alcohol dependence, uncomplicated: Secondary | ICD-10-CM

## 2019-02-05 MED ORDER — SERTRALINE HCL 50 MG PO TABS: 50.0000 mg | ORAL_TABLET | Freq: Every day | ORAL | 0 refills | Status: AC

## 2019-02-05 MED ORDER — NICOTINE 21 MG/24HR TD PT24: 21.0000 mg | MEDICATED_PATCH | Freq: Every day | TRANSDERMAL | 0 refills | Status: AC

## 2019-02-05 MED ORDER — ARIPIPRAZOLE 2 MG PO TABS: 2.0000 mg | ORAL_TABLET | Freq: Every day | ORAL | 0 refills | Status: AC

## 2019-02-05 MED ORDER — GABAPENTIN 100 MG PO CAPS: 200.0000 mg | ORAL_CAPSULE | Freq: Three times a day (TID) | ORAL | 0 refills | Status: DC

## 2019-02-05 MED ORDER — ADULT MULTIVITAMIN W/MINERALS CH: 1.0000 | ORAL_TABLET | Freq: Every day | ORAL | Status: DC

## 2019-02-05 MED ORDER — TRAZODONE HCL 50 MG PO TABS: 50.0000 mg | ORAL_TABLET | Freq: Every evening | ORAL | 0 refills | Status: AC | PRN

## 2019-02-05 MED ORDER — ACETAMINOPHEN 325 MG PO TABS: 650.0000 mg | ORAL_TABLET | Freq: Four times a day (QID) | ORAL | Status: DC | PRN

## 2019-02-05 NOTE — Tx Team (Signed)
Interdisciplinary Treatment and Diagnostic Plan Update  02/05/2019 Time of Session: 9:00AM Babak Lack MRN: [id number]  Principal Diagnosis: Severe recurrent major depression without psychotic features [facility])  Secondary Diagnoses: Principal Problem:   Severe recurrent major depression without psychotic features (HCC) Active Problems:   Alcohol abuse   Cocaine abuse (HCC)   Current Medications:  Current Facility-Administered Medications  Medication Dose Route Frequency Provider Last Rate Last Dose  . acetaminophen (TYLENOL) tablet 650 mg  650 mg Oral Q6H PRN Nira Conn A, NP      . alum & mag hydroxide-simeth (MAALOX/MYLANTA) 200-200-20 MG/5ML suspension 30 mL  30 mL Oral Q4H PRN Nira Conn A, NP      . ARIPiprazole (ABILIFY) tablet 2 mg  2 mg Oral Daily Cobos, Rockey Situ, MD   2 mg at 02/05/19 0093  . gabapentin (NEURONTIN) capsule 200 mg  200 mg Oral TID Antonieta Pert, MD   200 mg at 02/05/19 8182  . magnesium hydroxide (MILK OF MAGNESIA) suspension 30 mL  30 mL Oral Daily PRN Nira Conn A, NP      . multivitamin with minerals tablet 1 tablet  1 tablet Oral Daily Cobos, Rockey Situ, MD   1 tablet at 02/05/19 9937  . nicotine (NICODERM CQ - dosed in mg/24 hours) patch 21 mg  21 mg Transdermal Daily Cobos, Rockey Situ, MD   21 mg at 02/05/19 0824  . sertraline (ZOLOFT) tablet 50 mg  50 mg Oral Daily Cobos, Rockey Situ, MD   50 mg at 02/05/19 1696  . thiamine (VITAMIN B-1) tablet 100 mg  100 mg Oral Daily Cobos, Rockey Situ, MD   100 mg at 02/05/19 7893  . traZODone (DESYREL) tablet 50 mg  50 mg Oral QHS PRN Nira Conn A, NP   50 mg at 02/04/19 2201   PTA Medications: No medications prior to admission.    Patient Stressors: Legal issue Loss of family members Marital or family conflict Occupational concerns Substance abuse Traumatic event  Patient Strengths: Ability for insight Active sense of humor Motivation for treatment/growth Physical Health Supportive  family/friends  Treatment Modalities: Medication Management, Group therapy, Case management,  1 to 1 session with clinician, Psychoeducation, Recreational therapy.   Physician Treatment Plan for Primary Diagnosis: Severe recurrent major depression without psychotic features (HCC) Long Term Goal(s): Improvement in symptoms so as ready for discharge Improvement in symptoms so as ready for discharge   Short Term Goals: Ability to identify changes in lifestyle to reduce recurrence of condition will improve Ability to maintain clinical measurements within normal limits will improve Ability to maintain clinical measurements within normal limits will improve Ability to identify triggers associated with substance abuse/mental health issues will improve  Medication Management: Evaluate patient's response, side effects, and tolerance of medication regimen.  Therapeutic Interventions: 1 to 1 sessions, Unit Group sessions and Medication administration.  Evaluation of Outcomes: Adequate for Discharge  Physician Treatment Plan for Secondary Diagnosis: Principal Problem:   Severe recurrent major depression without psychotic features (HCC) Active Problems:   Alcohol abuse   Cocaine abuse (HCC)  Long Term Goal(s): Improvement in symptoms so as ready for discharge Improvement in symptoms so as ready for discharge   Short Term Goals: Ability to identify changes in lifestyle to reduce recurrence of condition will improve Ability to maintain clinical measurements within normal limits will improve Ability to maintain clinical measurements within normal limits will improve Ability to identify triggers associated with substance abuse/mental health issues will improve  Medication Management: Evaluate patient's response, side effects, and tolerance of medication regimen.  Therapeutic Interventions: 1 to 1 sessions, Unit Group sessions and Medication administration.  Evaluation of Outcomes: Adequate  for Discharge   RN Treatment Plan for Primary Diagnosis: Severe recurrent major depression without psychotic features (HCC) Long Term Goal(s): Knowledge of disease and therapeutic regimen to maintain health will improve  Short Term Goals: Ability to verbalize feelings will improve, Ability to disclose and discuss suicidal ideas and Ability to identify and develop effective coping behaviors will improve  Medication Management: RN will administer medications as ordered by provider, will assess and evaluate patient's response and provide education to patient for prescribed medication. RN will report any adverse and/or side effects to prescribing provider.  Therapeutic Interventions: 1 on 1 counseling sessions, Psychoeducation, Medication administration, Evaluate responses to treatment, Monitor vital signs and CBGs as ordered, Perform/monitor CIWA, COWS, AIMS and Fall Risk screenings as ordered, Perform wound care treatments as ordered.  Evaluation of Outcomes: Adequate for Discharge   LCSW Treatment Plan for Primary Diagnosis: Severe recurrent major depression without psychotic features (HCC) Long Term Goal(s): Safe transition to appropriate next level of care at discharge, Engage patient in therapeutic group addressing interpersonal concerns.  Short Term Goals: Engage patient in aftercare planning with referrals and resources, Increase social support, Increase emotional regulation, Identify triggers associated with mental health/substance abuse issues and Increase skills for wellness and recovery  Therapeutic Interventions: Assess for all discharge needs, 1 to 1 time with Social worker, Explore available resources and support systems, Assess for adequacy in community support network, Educate family and significant other(s) on suicide prevention, Complete Psychosocial Assessment, Interpersonal group therapy.  Evaluation of Outcomes: Adequate for Discharge   Progress in Treatment: Attending  groups: Yes Participating in groups: Yes. Taking medication as prescribed: Yes. Toleration medication: Yes. Family/Significant other contact made: Yes, individual(s) contacted:  sister Patient understands diagnosis: Yes. Discussing patient identified problems/goals with staff: Yes. Medical problems stabilized or resolved: Yes. Denies suicidal/homicidal ideation: Yes. Issues/concerns per patient self-inventory: No.   New problem(s) identified: Yes, homeless and house-hopping.   New Short Term/Long Term Goal(s): detox, medication management for mood stabilization; elimination of SI thoughts; development of comprehensive mental wellness/sobriety plan.  Patient Goals: Would like housing assistance.   Discharge Plan or Barriers: Discharge today, staying with family for two days and then going to [facility] on Sunday 03/01  Reason for Continuation of Hospitalization: Anxiety  Estimated Length of Stay: discharge today  Attendees: Patient: Max Gomez 02/05/2019 10:23 AM  Physician: Marguerita Merles 02/05/2019 10:23 AM  Nursing:  02/05/2019 10:23 AM  RN Care Manager: 02/05/2019 10:23 AM  Social Worker: Enid Cutter, Theresia Majors 02/05/2019 10:23 AM  Recreational Therapist:  02/05/2019 10:23 AM  Other:  02/05/2019 10:23 AM  Other:  02/05/2019 10:23 AM  Other: 02/05/2019 10:23 AM    Scribe for Treatment Team: Darreld Mclean, LCSWA 02/05/2019 10:23 AM

## 2019-02-05 NOTE — [facility] Suicide Risk Assessment (Signed)
[facility] Discharge Suicide Risk Assessment   Principal Problem: <principal problem not specified> Discharge Diagnoses: Active Problems:   Severe recurrent major depression without psychotic features (HCC)   Total Time spent with patient: 15 minutes  Musculoskeletal: Strength & Muscle Tone: within normal limits Gait & Station: normal Patient leans: N/A  Psychiatric Specialty Exam: Review of Systems  All other systems reviewed and are negative.   Blood pressure 103/88, pulse 68, temperature 98.1 F (36.7 C), temperature source Oral, resp. rate 16, height 5\' 8"  (1.727 m), weight 60.8 kg, SpO2 100 %.Body mass index is 20.37 kg/m.  General Appearance: Casual  Eye Contact::  Good  Speech:  Normal Rate409  Volume:  Normal  Mood:  Euthymic  Affect:  Congruent  Thought Process:  Coherent and Descriptions of Associations: Intact  Orientation:  Full (Time, Place, and Person)  Thought Content:  Logical  Suicidal Thoughts:  No  Homicidal Thoughts:  No  Memory:  Immediate;   Fair Recent;   Fair Remote;   Fair  Judgement:  Intact  Insight:  Fair  Psychomotor Activity:  Normal  Concentration:  Good  Recall:  Good  Fund of Knowledge:Fair  Language: Good  Akathisia:  Negative  Handed:  Right  AIMS (if indicated):     Assets:  Communication Skills Desire for Improvement Physical Health Resilience  Sleep:  Number of Hours: 6.5  Cognition: WNL  ADL's:  Intact   Mental Status Per Nursing Assessment::   On Admission:  NA(denied SI)  Demographic Factors:  Male, Divorced or widowed, Low socioeconomic status and Unemployed  Loss Factors: NA  Historical Factors: Impulsivity  Risk Reduction Factors:   Positive coping skills or problem solving skills  Continued Clinical Symptoms:  Depression:   Comorbid alcohol abuse/dependence Impulsivity Alcohol/Substance Abuse/Dependencies  Cognitive Features That Contribute To Risk:  None    Suicide Risk:  Minimal: No identifiable  suicidal ideation.  Patients presenting with no risk factors but with morbid ruminations; may be classified as minimal risk based on the severity of the depressive symptoms  Follow-up Information    [facility] Go on 02/07/2019.   Why:  Your intake admission is scheduled for 10:00am on Sunday, 02/07/2019. Please bring your hospital discharge paperwork with you. Contact information: [address] [place] [address] [phone]        [facility] Go to.   Specialty:  Professional Counselor Why:  Walk in hours are Monday through Friday 8am- 3pm. Please bring photo ID and your hospital discharge paperwork with you.  Contact information: [facility] [address] [place] [address] [phone]           Plan Of Care/Follow-up recommendations:  Activity:  ad lib  Antonieta Pert, MD 02/05/2019, 7:46 AM

## 2019-02-05 NOTE — Progress Notes (Signed)
D: Pt denies SI/HI/AVH. Pt is pleasant and cooperative. Pt visible in dayroom playing cards with peers. Pt stated he was doing better this evening.  A: Pt was offered support and encouragement. Pt was given scheduled medications. Pt was encourage to attend groups. Q 15 minute checks were done for safety.  R:Pt attends groups and interacts well with peers and staff. Pt is taking medication. Pt has no complaints.Pt receptive to treatment and safety maintained on unit.  Problem: Education: Goal: Emotional status will improve Outcome: Progressing   Problem: Education: Goal: Mental status will improve Outcome: Progressing

## 2019-02-05 NOTE — Progress Notes (Signed)
Recreation Therapy Notes  Date:  2.28.20 Time: 0930 Location: 300 [address] Dayroom  Group Topic: Stress Management  Goal Area(s) Addresses:  Patient will identify positive stress management techniques. Patient will identify benefits of using stress management post d/c.  Behavioral Response: Engaged  Intervention: Stress Management  Activity :  Progressive Muscle Relaxation.  LRT introduced the stress management technique of stress management.  LRT read a script that focused on tensing and relaxing each muscle group individually.  Patients were to follow along as the script was read to engage in activity.  Education:  Stress Management, Discharge Planning.   Education Outcome: Acknowledges Education  Clinical Observations/Feedback:  Pt attended and participated in group.    Caroll Rancher, LRT/CTRS         Caroll Rancher A 02/05/2019 10:48 AM

## 2019-02-05 NOTE — Progress Notes (Signed)
Pt completed all dc preparations, including completion of SSP. On his self inventory, he writes he denies having SI today and he rated his depression, hopelessness and anxeity " 5/3/4", respectively. He is given his dc f/u, these instrucitons are reviewed with him, he verbalizes understanding and willingness to comply. HE is given 1 wk sample meds from pahrmacy and he is given cc of all dc instrucitons ( SRA, AVS, and SSP and transition record). He is given all belongings in his locker and he is escorted to bldg entrance and dc'd per MD order.

## 2019-02-05 NOTE — Progress Notes (Signed)
  [facility] Adult Case Management Discharge Plan :  Will you be returning to the same living situation after discharge:  No. Staying with niece for 2 days and then going to [facility] At discharge, do you have transportation home?: Yes,  nephew picking up Do you have the ability to pay for your medications: No. Given medication samples  Release of information consent forms completed and in the chart. Work letter and medication samples on chart.  Patient to Follow up at: Follow-up Information    [facility]. Go on 02/07/2019.   Why:  Your intake admission is scheduled for 10:00am on Sunday, 02/07/2019. Please bring your hospital discharge paperwork with you. Contact information: [address] [place] [address] [phone]        [facility] Go to.   Specialty:  Professional Counselor Why:  Walk in hours are Monday through Friday 8am- 3pm. Please bring photo ID and your hospital discharge paperwork with you.  Contact information: [facility] [address] [place] [address] [phone]           Next level of care provider has access to [facility] Link:no  Safety Planning and Suicide Prevention discussed: Yes,  with sister  Have you used any form of tobacco in the last 30 days? (Cigarettes, Smokeless Tobacco, Cigars, and/or Pipes): Yes  Has patient been referred to the Quitline?: Patient refused referral  Patient has been referred for addiction treatment: Yes  Darreld Mclean, LCSWA 02/05/2019, 9:14 AM
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
## 2019-10-14 ENCOUNTER — Emergency Department (HOSPITAL_COMMUNITY)
Admission: EM | Admit: 2019-10-14 | Discharge: 2019-10-14 | Disposition: A | Discharge status: Home / Self Care | Payer: Self-pay | Attending: Emergency Medicine | Admitting: Emergency Medicine

## 2019-10-14 ENCOUNTER — Emergency Department (HOSPITAL_COMMUNITY): Payer: Self-pay

## 2019-10-14 ENCOUNTER — Other Ambulatory Visit: Payer: Self-pay

## 2019-10-14 ENCOUNTER — Encounter (HOSPITAL_COMMUNITY): Payer: Self-pay | Admitting: Emergency Medicine

## 2019-10-14 DIAGNOSIS — S43101A Unspecified dislocation of right acromioclavicular joint, initial encounter: Secondary | ICD-10-CM | POA: Diagnosis not present

## 2019-10-14 DIAGNOSIS — Z79899 Other long term (current) drug therapy: Secondary | ICD-10-CM | POA: Diagnosis not present

## 2019-10-14 DIAGNOSIS — Y999 Unspecified external cause status: Secondary | ICD-10-CM | POA: Diagnosis not present

## 2019-10-14 DIAGNOSIS — F17210 Nicotine dependence, cigarettes, uncomplicated: Secondary | ICD-10-CM | POA: Diagnosis not present

## 2019-10-14 DIAGNOSIS — Y929 Unspecified place or not applicable: Secondary | ICD-10-CM | POA: Insufficient documentation

## 2019-10-14 DIAGNOSIS — Y9389 Activity, other specified: Secondary | ICD-10-CM | POA: Insufficient documentation

## 2019-10-14 DIAGNOSIS — S4991XA Unspecified injury of right shoulder and upper arm, initial encounter: Secondary | ICD-10-CM | POA: Diagnosis present

## 2019-10-14 MED ORDER — NAPROXEN 500 MG PO TABS: 500.0000 mg | ORAL_TABLET | Freq: Two times a day (BID) | ORAL | 0 refills | Status: DC

## 2019-10-14 MED ORDER — IBUPROFEN 800 MG PO TABS
800.0000 mg | ORAL_TABLET | Freq: Once | ORAL | Status: AC
  Administered 2019-10-14: 800 mg via ORAL
  Filled 2019-10-14: qty 1

## 2019-10-14 NOTE — ED Notes (Signed)
Pt verbalized understanding of discharge instructions, prescribed medication, and reccommended follow-up. Time given for questions and teachback
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
## 2019-10-14 NOTE — Discharge Instructions (Addendum)
Take your ibuprofen 800 mg as prescribed for any pain and inflammation.  Please use your immobilizer sling for comfort.  Call Dr. Griffin Basil tomorrow with orthopedics to schedule an appointment for further evaluation and management of your [facility] joint separation.
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
## 2024-06-16 ENCOUNTER — Ambulatory Visit (INDEPENDENT_AMBULATORY_CARE_PROVIDER_SITE_OTHER): Admitting: Primary Care

## 2024-12-14 ENCOUNTER — Emergency Department (HOSPITAL_COMMUNITY)

## 2024-12-14 ENCOUNTER — Other Ambulatory Visit: Payer: Self-pay

## 2024-12-14 ENCOUNTER — Inpatient Hospital Stay (HOSPITAL_COMMUNITY)

## 2024-12-14 ENCOUNTER — Inpatient Hospital Stay (HOSPITAL_COMMUNITY)
Admission: EM | Admit: 2024-12-14 | Discharge: 2024-12-16 | DRG: 183 | Disposition: A | Discharge status: Home / Self Care | Attending: General Surgery | Admitting: General Surgery

## 2024-12-14 DIAGNOSIS — S2249XA Multiple fractures of ribs, unspecified side, initial encounter for closed fracture: Secondary | ICD-10-CM | POA: Diagnosis present

## 2024-12-14 DIAGNOSIS — S27331A Laceration of lung, unilateral, initial encounter: Secondary | ICD-10-CM | POA: Diagnosis present

## 2024-12-14 DIAGNOSIS — S2232XA Fracture of one rib, left side, initial encounter for closed fracture: Secondary | ICD-10-CM | POA: Diagnosis present

## 2024-12-14 DIAGNOSIS — S272XXA Traumatic hemopneumothorax, initial encounter: Secondary | ICD-10-CM | POA: Diagnosis present

## 2024-12-14 DIAGNOSIS — F17210 Nicotine dependence, cigarettes, uncomplicated: Secondary | ICD-10-CM | POA: Diagnosis present

## 2024-12-14 DIAGNOSIS — S2242XA Multiple fractures of ribs, left side, initial encounter for closed fracture: Principal | ICD-10-CM | POA: Diagnosis present

## 2024-12-14 DIAGNOSIS — S27321A Contusion of lung, unilateral, initial encounter: Secondary | ICD-10-CM | POA: Diagnosis present

## 2024-12-14 DIAGNOSIS — Y92410 Unspecified street and highway as the place of occurrence of the external cause: Secondary | ICD-10-CM

## 2024-12-14 DIAGNOSIS — T797XXA Traumatic subcutaneous emphysema, initial encounter: Secondary | ICD-10-CM | POA: Diagnosis present

## 2024-12-14 DIAGNOSIS — Z79899 Other long term (current) drug therapy: Secondary | ICD-10-CM

## 2024-12-14 DIAGNOSIS — G8911 Acute pain due to trauma: Secondary | ICD-10-CM | POA: Diagnosis present

## 2024-12-14 DIAGNOSIS — R079 Chest pain, unspecified: Secondary | ICD-10-CM | POA: Diagnosis present

## 2024-12-14 LAB — COMPREHENSIVE METABOLIC PANEL WITH GFR
ALT: 14 U/L (ref 0–44)
AST: 31 U/L (ref 15–41)
Albumin: 3.8 g/dL (ref 3.5–5.0)
Alkaline Phosphatase: 79 U/L (ref 38–126)
Anion gap: 11 (ref 5–15)
BUN: 12 mg/dL (ref 8–23)
CO2: 24 mmol/L (ref 22–32)
Calcium: 8.5 mg/dL — ABNORMAL LOW (ref 8.9–10.3)
Chloride: 102 mmol/L (ref 98–111)
Creatinine, Ser: 1.15 mg/dL (ref 0.61–1.24)
GFR, Estimated: >60 mL/min
Glucose, Bld: 151 mg/dL — ABNORMAL HIGH (ref 70–99)
Potassium: 4.4 mmol/L (ref 3.5–5.1)
Sodium: 138 mmol/L (ref 135–145)
Total Bilirubin: 0.7 mg/dL (ref 0.0–1.2)
Total Protein: 6.5 g/dL (ref 6.5–8.1)

## 2024-12-14 LAB — CBC
HCT: 38.9 % — ABNORMAL LOW (ref 39.0–52.0)
Hemoglobin: 12.9 g/dL — ABNORMAL LOW (ref 13.0–17.0)
MCH: 29.5 pg (ref 26.0–34.0)
MCHC: 33.2 g/dL (ref 30.0–36.0)
MCV: 89 fL (ref 80.0–100.0)
Platelets: 274 K/uL (ref 150–400)
RBC: 4.37 MIL/uL (ref 4.22–5.81)
RDW: 14.8 % (ref 11.5–15.5)
WBC: 18.5 K/uL — ABNORMAL HIGH (ref 4.0–10.5)
nRBC: 0 % (ref 0.0–0.2)

## 2024-12-14 LAB — SAMPLE TO BLOOD BANK

## 2024-12-14 LAB — I-STAT CHEM 8, ED
BUN: 12 mg/dL (ref 8–23)
Calcium, Ion: 1.12 mmol/L — ABNORMAL LOW (ref 1.15–1.40)
Chloride: 102 mmol/L (ref 98–111)
Creatinine, Ser: 1.2 mg/dL (ref 0.61–1.24)
Glucose, Bld: 151 mg/dL — ABNORMAL HIGH (ref 70–99)
HCT: 41 % (ref 39.0–52.0)
Hemoglobin: 13.9 g/dL (ref 13.0–17.0)
Potassium: 4.1 mmol/L (ref 3.5–5.1)
Sodium: 138 mmol/L (ref 135–145)
TCO2: 24 mmol/L (ref 22–32)

## 2024-12-14 LAB — I-STAT CG4 LACTIC ACID, ED: Lactic Acid, Venous: 1 mmol/L (ref 0.5–1.9)

## 2024-12-14 LAB — PROTIME-INR
INR: 1.1 (ref 0.8–1.2)
Prothrombin Time: 14.7 s (ref 11.4–15.2)

## 2024-12-14 LAB — HIV ANTIBODY (ROUTINE TESTING W REFLEX): HIV Screen 4th Generation wRfx: NONREACTIVE

## 2024-12-14 LAB — ETHANOL: Alcohol, Ethyl (B): <15 mg/dL

## 2024-12-14 MED ORDER — SODIUM CHLORIDE 0.9 % IV BOLUS
1000.0000 mL | Freq: Once | INTRAVENOUS | Status: AC
  Administered 2024-12-14: 1000 mL via INTRAVENOUS

## 2024-12-14 MED ORDER — ONDANSETRON HCL 4 MG/2ML IJ SOLN: 4.0000 mg | Freq: Four times a day (QID) | INTRAMUSCULAR | Status: DC | PRN

## 2024-12-14 MED ORDER — HYDROMORPHONE HCL 1 MG/ML IJ SOLN
1.0000 mg | Freq: Once | INTRAMUSCULAR | Status: AC
  Administered 2024-12-14: 1 mg via INTRAVENOUS
  Filled 2024-12-14: qty 1

## 2024-12-14 MED ORDER — METOPROLOL TARTRATE 5 MG/5ML IV SOLN: 5.0000 mg | Freq: Four times a day (QID) | INTRAVENOUS | Status: DC | PRN

## 2024-12-14 MED ORDER — OXYCODONE HCL 5 MG PO TABS: 5.0000 mg | ORAL_TABLET | ORAL | Status: DC | PRN

## 2024-12-14 MED ORDER — SENNA 8.6 MG PO TABS
1.0000 | ORAL_TABLET | Freq: Two times a day (BID) | ORAL | Status: DC
  Administered 2024-12-14 – 2024-12-15 (×2): 8.6 mg via ORAL
  Filled 2024-12-14 (×4): qty 1

## 2024-12-14 MED ORDER — OXYCODONE HCL 5 MG PO TABS
10.0000 mg | ORAL_TABLET | ORAL | Status: DC | PRN
  Administered 2024-12-14: 10 mg via ORAL
  Filled 2024-12-14: qty 2

## 2024-12-14 MED ORDER — ACETAMINOPHEN 500 MG PO TABS
1000.0000 mg | ORAL_TABLET | Freq: Four times a day (QID) | ORAL | Status: DC
  Administered 2024-12-14 – 2024-12-16 (×8): 1000 mg via ORAL
  Filled 2024-12-14 (×8): qty 2

## 2024-12-14 MED ORDER — SODIUM CHLORIDE 0.9% FLUSH: 3.0000 mL | INTRAVENOUS | Status: DC | PRN

## 2024-12-14 MED ORDER — POLYETHYLENE GLYCOL 3350 17 G PO PACK: 17.0000 g | PACK | Freq: Every day | ORAL | Status: DC | PRN

## 2024-12-14 MED ORDER — METHOCARBAMOL 500 MG PO TABS
500.0000 mg | ORAL_TABLET | Freq: Three times a day (TID) | ORAL | Status: DC
  Administered 2024-12-14 – 2024-12-15 (×2): 500 mg via ORAL
  Filled 2024-12-14 (×2): qty 1

## 2024-12-14 MED ORDER — HYDRALAZINE HCL 20 MG/ML IJ SOLN: 10.0000 mg | INTRAMUSCULAR | Status: DC | PRN

## 2024-12-14 MED ORDER — ENOXAPARIN SODIUM 30 MG/0.3ML IJ SOSY
30.0000 mg | PREFILLED_SYRINGE | Freq: Two times a day (BID) | INTRAMUSCULAR | Status: DC
  Administered 2024-12-15 – 2024-12-16 (×3): 30 mg via SUBCUTANEOUS
  Filled 2024-12-14 (×3): qty 0.3

## 2024-12-14 MED ORDER — SODIUM CHLORIDE 0.9% FLUSH
3.0000 mL | Freq: Two times a day (BID) | INTRAVENOUS | Status: DC
  Administered 2024-12-14 – 2024-12-16 (×4): 3 mL via INTRAVENOUS

## 2024-12-14 MED ORDER — METHOCARBAMOL 1000 MG/10ML IJ SOLN: 500.0000 mg | Freq: Three times a day (TID) | INTRAMUSCULAR | Status: DC

## 2024-12-14 MED ORDER — HYDROMORPHONE HCL 1 MG/ML IJ SOLN
1.0000 mg | INTRAMUSCULAR | Status: DC | PRN
  Administered 2024-12-15: 1 mg via INTRAVENOUS
  Filled 2024-12-14: qty 1

## 2024-12-14 MED ORDER — IOHEXOL 350 MG/ML SOLN
75.0000 mL | Freq: Once | INTRAVENOUS | Status: AC | PRN
  Administered 2024-12-14: 75 mL via INTRAVENOUS

## 2024-12-14 MED ORDER — ONDANSETRON 4 MG PO TBDP: 4.0000 mg | ORAL_TABLET | Freq: Four times a day (QID) | ORAL | Status: DC | PRN

## 2024-12-14 MED ORDER — SODIUM CHLORIDE 0.9 % IV SOLN: 250.0000 mL | INTRAVENOUS | Status: AC | PRN

## 2024-12-14 NOTE — Progress Notes (Signed)
 Transition of Care [facility]) - CAGE-AID Screening   Patient Details  Name: Max Gomez MRN: [id number] Date of Birth: 06-Jan-1961  Transition of Care [facility]) CM/SW Contact:    Sallyanne MALVA Mettle, RN Phone Number: 12/14/2024, 8:22 PM   Clinical Narrative:  Denies substance abuse, no resources indicated  CAGE-AID Screening:    Have You Ever Felt You Ought to Cut Down on Your Drinking or Drug Use?: No Have People Annoyed You By Critizing Your Drinking Or Drug Use?: No Have You Felt Bad Or Guilty About Your Drinking Or Drug Use?: No Have You Ever Had a Drink or Used Drugs First Thing In The Morning to Steady Your Nerves or to Get Rid of a Hangover?: No CAGE-AID Score: 0  Substance Abuse Education Offered: No
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
## 2024-12-15 ENCOUNTER — Inpatient Hospital Stay (HOSPITAL_COMMUNITY)

## 2024-12-15 LAB — CBC
HCT: 35.7 % — ABNORMAL LOW (ref 39.0–52.0)
Hemoglobin: 11.6 g/dL — ABNORMAL LOW (ref 13.0–17.0)
MCH: 29 pg (ref 26.0–34.0)
MCHC: 32.5 g/dL (ref 30.0–36.0)
MCV: 89.3 fL (ref 80.0–100.0)
Platelets: 225 K/uL (ref 150–400)
RBC: 4 MIL/uL — ABNORMAL LOW (ref 4.22–5.81)
RDW: 15.1 % (ref 11.5–15.5)
WBC: 8.8 K/uL (ref 4.0–10.5)
nRBC: 0 % (ref 0.0–0.2)

## 2024-12-15 MED ORDER — FOLIC ACID 1 MG PO TABS
1.0000 mg | ORAL_TABLET | Freq: Every day | ORAL | Status: DC
  Administered 2024-12-15 – 2024-12-16 (×2): 1 mg via ORAL
  Filled 2024-12-15 (×2): qty 1

## 2024-12-15 MED ORDER — LORAZEPAM 2 MG/ML IJ SOLN: 1.0000 mg | INTRAMUSCULAR | Status: DC | PRN

## 2024-12-15 MED ORDER — METHOCARBAMOL 1000 MG/10ML IJ SOLN
500.0000 mg | Freq: Four times a day (QID) | INTRAMUSCULAR | Status: DC
  Filled 2024-12-15: qty 10

## 2024-12-15 MED ORDER — THIAMINE HCL 100 MG/ML IJ SOLN: 100.0000 mg | Freq: Every day | INTRAMUSCULAR | Status: DC

## 2024-12-15 MED ORDER — LORAZEPAM 1 MG PO TABS: 0.0000 mg | ORAL_TABLET | Freq: Three times a day (TID) | ORAL | Status: DC

## 2024-12-15 MED ORDER — METHOCARBAMOL 500 MG PO TABS
500.0000 mg | ORAL_TABLET | Freq: Four times a day (QID) | ORAL | Status: DC
  Administered 2024-12-15 – 2024-12-16 (×5): 500 mg via ORAL
  Filled 2024-12-15 (×5): qty 1

## 2024-12-15 MED ORDER — LORAZEPAM 1 MG PO TABS: 1.0000 mg | ORAL_TABLET | ORAL | Status: DC | PRN

## 2024-12-15 MED ORDER — LIDOCAINE 5 % EX PTCH
2.0000 | MEDICATED_PATCH | CUTANEOUS | Status: DC
  Administered 2024-12-15 – 2024-12-16 (×2): 2 via TRANSDERMAL
  Filled 2024-12-15 (×2): qty 2

## 2024-12-15 MED ORDER — OXYCODONE HCL 5 MG PO TABS
5.0000 mg | ORAL_TABLET | ORAL | Status: DC | PRN
  Administered 2024-12-15 – 2024-12-16 (×5): 10 mg via ORAL
  Filled 2024-12-15 (×5): qty 2

## 2024-12-15 MED ORDER — THIAMINE MONONITRATE 100 MG PO TABS
100.0000 mg | ORAL_TABLET | Freq: Every day | ORAL | Status: DC
  Administered 2024-12-15 – 2024-12-16 (×2): 100 mg via ORAL
  Filled 2024-12-15 (×2): qty 1

## 2024-12-15 MED ORDER — LORAZEPAM 1 MG PO TABS: 0.0000 mg | ORAL_TABLET | ORAL | Status: DC

## 2024-12-15 MED ORDER — ADULT MULTIVITAMIN W/MINERALS CH
1.0000 | ORAL_TABLET | Freq: Every day | ORAL | Status: DC
  Administered 2024-12-15 – 2024-12-16 (×2): 1 via ORAL
  Filled 2024-12-15 (×2): qty 1
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
## 2024-12-15 NOTE — Progress Notes (Signed)
 PT Cancellation Note  Patient Details Name: Max Gomez MRN: [id number] DOB: 1961-07-28   Cancelled Treatment:    Reason Eval/Treat Not Completed: Fatigue/lethargy limiting ability to participate; already up with OT this morning, prefers to space out mobility later today.  Will follow up later this pm.   Montie Portal 12/15/2024, 12:02 PM Micheline Portal, PT Acute Rehabilitation Services Office:[phone] 12/15/2024

## 2024-12-15 NOTE — Discharge Instructions (Signed)
 What Is Trauma?  Depending upon the professional you are interacting with, trauma may be defined differently.  Medical professionals might define trauma as a sudden onset physical injury that requires immediate medical attention ([employer]  [facility], n.d.). For example, traumas result from a car crash, significant fall, burn, or sports injury. A professional in the mental health field, such as a pharmacist, hospital, child psychotherapist, psychologist, or psychiatric medical doctor, might define trauma differently. This expert might define trauma as a result from an event, series of events, or set of circumstances experienced by an individual as physically or emotionally harmful or life-threating with lasting adverse effects on the individual's functioning and mental, physical, social, emotional, or spiritual well-being (Substance Abuse and Mental Health Services Administration, n.d.).  Notably, both ways of viewing trauma have one aspect in common: the trauma may impact a person's life mentally, physically, socially, emotionally, or spiritually after the trauma has been experienced. Particularly for persons who already experience mental illnesses, addictions, or physical health issues, the impact of trauma can hinder recovery. However, there are ways to prevent and cope with these impacts. These tools and resources will be discussed later.   What Is Acute Stress Response? When a person experiences something mentally or physically terrifying, they may experience what is known as a archivist, flight, or freeze response. This response is also known as an acute stress response. Both real and imaginary threats can cause this hormonal response. Think back to a time where you may have experienced your car gliding on ice, or when you encountered a growling dog. You likely felt the fight-or-flight response. You may also experience a fight-or-flight response if you are afraid of heights. Even though you are secured  behind a railing and are in a stable position, your body may perceive danger and respond.   When you are undergoing acute stress response, you may experience rapid heartbeat and breathing, pale or flushed skin, dilated pupils, or trembling. Approximately 20 to 60 minutes after the threat is gone, the body will return to pre-arousal levels Adams, 2019).  After you experience an acute stress response, it is common to feel: intense fear, helpless, horrified, numb, detached from reality, that you are in a daze, or that you can't recall all that happened. These reactions are normal and will subside.   What Happens After Trauma?  While the body's natural fight, flight, or freeze response subsides 20 to 60 minutes after the threat is gone, the brain stores the event as a long-term memory. This psychological process was functional for primitive humans. For example, a cave person might find themselves experiencing a fight, flight, or freeze response after being chased by a bear. Their brain would store this memory long-term to remind them frequently that they should avoid situations where a bear might be present. However, in modern society, this primitive process may be harmful. It can be painful to be constantly reminded of a traumatic situation (White).  These painful memories are very common within the first few weeks after a trauma. These difficult memories may arise when you experience a trigger. For example, a cars brake lights after you experience a car wreck may cause you to recall the event and experience negative emotions. Other ways memories arise are in nightmares or flashbacks (White).  What Is PTSD?  While intrusive memories are common within the initial days and weeks after a traumatic experience, they should become less frequent and less distressing over time. However, these memories might persist. Some people  experience what is known as Post Traumatic Stress Disorder, or PTSD, when symptoms last  for more than a few months, are very upsetting, and disrupt your daily life.  Symptoms include reliving the event, avoiding situations that remind you of the event, negative changes in beliefs and feelings, feeling jittery, or hyperalert. After a trauma is experienced, if you are having difficulty coping with painful memories or are experiencing PTSD symptoms, it is crucial to seek support. If you are experiencing life-threatening symptoms, such as suicidal or homicidal thoughts, call 911 or the National Suicide Prevention Lifeline at 518-861-2998.  While PTSD is often referred to in reference to veterans returning from combat, it can be experienced by anyone experiencing a life-threating event such as a natural disaster, car accident, or sexual assault ([place]  Department of [address], [place]).    Coping With Negative Emotions And Responses:  Tips that can help you cope include: accept what is happening, keep your life as normal as you can, face your fears, be more active, watch what you drink, and learn to relax. 1) Accept that these feelings are unpleasant but normal. 2) Try to do the usual things you do, such as stay at work if possible, keep up your social life, visit your family and friends. 3) Face your fears. The more you avoid, the more problems you build up in the future, and the more you face up, the more you improve. 4) Be more active by exercising and getting out and about as much as you can. 5) Watch what you drink. People commonly drink more alcohol after a trauma, but this can make matters worse. 6) Learn to relax. Try to find somewhere quiet, put down your cell phone, lie back, and practice taking deep breaths (White).   Tips For Stopping The Nightmares:   If you are experiencing nightmares, it may be helpful to keep a pen and paper at your bedside. When you wake from a nightmare, record your dream in as much detail as you can. Although this will be uncomfortable, reread the  narrative, multiple times, or until you begin to feel less tense and uncomfortable (White)   Resources Speak With A Mental Health Professional Organizations offering free mental health services in [place], [place]: [facility] [address] [place], [place]  [address] [phone] [facility] Services of the [facility] [address] [place], [place] [address] [phone] [facility] [address] [place], [place] [address] [phone] [facility] [address], [place], [place] [address] [phone]  [facility] Mobile Crisis phone [phone] [facility] [address] [place], [place] [address] [phone] National Alliance on Mental Illness (NAMI) [place] [phone]    Organizations who accept/offer Medicaid - Sliding Fee Scale - Payment Assistance- Some Commercial Insurances [place], [place]:  [facility]  [address] [place], [place] [address] [phone] Ext.:[phone]  [facility] [address] [place], [address] [place], [place] [address] [phone] Ext.:[phone]  [facility] [address] [place], [place] [address] [phone]  [employer] (residential facility) [phone] [address] [place], [place] [address] [phone]   [facility] [address] [place], [place] [address] [phone]  [facility] (women only, Christian based) [address], [place], [place] [address] [phone]  [facility] [address] [place], [place] [address] [phone]  [facility] [address] [place], [place] [address] [phone]  [phone]   [facility] [address], [place] [address] [phone]      Organizations who offer self-pay and insurance in [place], [place] [facility] [address] [place],  [place] [address] [phone]  [facility] [address] [place], [place] [address] [phone] [facility] [address] [place] [address] [place], [place] [address] [phone]  Jan[facility] [address] [place] [place] [address] [phone]    Support groups in [place], [place] Support groups provide confidential, comfortable environments for people to share their problems, learn new information, have meaningful discussions, and better manage their issues.  [facility] [address] [place], [place]  [address]  Group exploring the effects of PTSD on readjustment, family life, career options, interpersonal relationships, and anger management. $30+ per session  Call Dr. Dallas Blumenthal  [phone] [facility] [address] [place], [place]  [address]  Women embark on a journey of self-discovery by reflecting, reprocessing, and desensitizing their painful experiences. Most have experienced sexual abuse, domestic abuse, and other forms of trauma $50-$60  Call Dr. Apolinar Campbell [phone] Dr. Donny Dales, [facility] [address] [place], [place]  [address] [phone]  This group will use the CBT model to challenge the thoughts, feelings, and beliefs as a result of a sexual assault incident. It will help identify their stuck points and aware of how their automatic thoughts keep them stuck. This will be in a comfortable, nonthreatening atmosphere. Clients will have been through individual therapy to address the incident before attending the group. They will have a full understanding of the model. $50-$60  Call Dr. Donny Dales  [phone]  (Mental health [place], n.d.)  Tips on finding a therapist/mental health professional within, and outside of [place], [place]. 1)Visit  [email or web address] /therapists > type in your city, postal code, or a therapists name >click refine, and sort by insurance, issues (specifically: trauma and PTSD), and any other preferred identities such as sexuality, gender, age, language, faith, or treatment modality (for trauma healing, recommended modalities include but are not limited to: CBT, EMDR, & hypnotherapy). 2) You may also find it helpful to visit www.google.com and search for: mental health resources in [place], [place].  3) Don't give up! Finding a therapist can be a challenging process. However, it will be beneficial in the end. Ask family, friends, or your primary care provider for help regarding mental health treatment.    Utilize Techniques and Resources At [employer] deep breathing, meditation, exercise, or yoga. While these activities are not a substitute for professional mental health treatment, many individuals find these activities helpful in coping. While we cannot erase our painful experiences, we can build resilience. Think of stress and resilience as a device that needs charging. These activities might help to recharge your body and mind. [facility] [place], n.d.)  Deep Breathing: Sit, stand, or lie down. Place your hands in a comfortable position, or place one on your heart and one on your stomach. Take deep breaths, perhaps counting each inhale and exhale. Focus on settling your mind and restoring your calm. You may even try repeating a mantra or pray during this time.   Yoga: Hold each pose for 3-5 breaths. Pay attention to the way each pose makes your body feel. Do not aim for perfection. Everyone has a different body, and your posture may not  look exactly like the picture. Modify poses to meet your physical abilities. Set an intention such as to feel calm or to heal and practice as frequently as you like.   Exercise:  All individuals need to exercise. However, exercise can be a beneficial tool in coping  with a stressful or traumatic event. Try taking a walk! Put on your favorite music, and think of three things you are grateful for.  Develop a Hobby: Hobbies can help to take your mind off of painful memories and emotions and create a sense of belonging and enjoyment. Try writing, learning a new language, cooking, painting, gardening, or photography.   Build Your Connection With Your Faith: Regardless of your faith or religious identity, people often find it helpful to speak to a spiritual leader, pastor, or minister, or connect with individuals who share their same faith.    Complete a Body Scan: Starting with your head, notice how it feels? Then move to your neck shoulders, chest, abdomen, arms, hands, upper legs, knees, lower legsfeet. Notice how each part of your body feels. You might try drawing a picture similar to this one, and color in each part of the body with how you feel. For example, if you hold tension in your shoulders, you might color them red.  Progressive Muscle Relaxation: In a comfortable position, close your eyes and take several deep breaths. Just as you would in a body scan, begin to slowly notice each part of your body from your head to your toes. Starting with the muscles in your head, tense, and relax your scalp, brow, jaw, and so on. Notice how they feel when they are tensed, versus when they are relaxed. Do this until you finally reach your toes.  Practice Guided Imagery: Start by thinking of a place you are most happy. It might be your childhood home, the beach, the mountains, or something else. Think of this place, and ponder each of your senses. What do you smell? What do you hear? What do you feel? What do you taste? What do you see?     Self Help Apps: While it is possible to practice these tools without the internet, there are many free resources online. Simply search for guided breathing exercises, guided meditations, guided imagery, or yoga class on  YouTube or Google. Or, try searching for these apps on your smart phone's application store.    PTSD Coach:  Designed for veterans and eli lilly and company service members with Post- Traumatic Stress Disorder (PTSD), but can be used by others Provides education about PTSD, information about professional care, opportunities to find support, and tools that can help users manage the stresses of daily life with PTSD for relaxation, anger management, and positive self-talk Includes a self-assessment for PTSD  Tactical Breather: Repetitive practice and training to learn to gain control over physiological and psychological responses to stress Tutorial and practice mode to help you learn breathing strategies  Worry Box: Diary to help you determine how to cope with the worry; If it's controllable, list the steps you can take to manage the worry; If it's not controllable, select from a list of coping statements to think about it differently Audio coach to guide you through exercises like imagery and self-talk  CBT- i Coach For people who experience symptoms of insomnia, developed for veterans Sleep education, positive sleep routines, and improving sleep environment   Happify Choose from over 30 different tracks such as conquer negative thoughts, cope with stress, build confidence, etc. to explore  tools, activities, and games Guided meditation activities Track progress  Insight Timer Thousands of guided meditations, music tracks, and ambient sounds to calm the mind, focus, sleep better and relax  [facility]. [place] [facility], n.d.)       In a time of Crisis: Integrated family Services/[facility].  Mobile Crisis Management provides immediate crisis response, 24/7.  Call [phone] Depending on the county. [facility] for MH/DD/SA [facility] is available 24 hours a day, 7 days a week. Customer Service Specialists will assist you to  find a crisis provider that is well-matched with your needs. Your local number is: [phone]  [facility] Center/Behavioral Health Urgent Care (BHUC) IOP, individual counseling, medication management [address] [place], [place] [address] [phone] Call for intake hours; Medicaid and Uninsured    Outpatient Providers  [facility] ([facility]) Group and individual counseling. [address] [place], [address] [phone] [place]: [phone]  [place]: [phone]  [facility] Offers IOP groups multiple times per week. [address] [place], [address] [phone] Takes Medicaid and other insurances.   [facility]  Chemical Dependency Intensive Outpatient Program (IOP) [address] [place], [place], [address] [phone]  [facility]  IOP and Partial Hospitalization Program  [address] [place], [address] [phone] Private Insurance, [place] only for partial hospitalization  [facility] [place] [address], [place], [address] [phone]  [facility] [phone] [address] [facility] Urgent Care (BHUC) IOP, individual counseling, medication management [address] [place], [address] [phone] Medicaid and [facility]  [facility] [address], [place], [address] [phone] Private Insurance and Self Pay   [facility] [address] [place], [address] [phone]   [facility]  [address], [place], [address]  [facility]  [address] [place], [address] [phone]  [facility]  [address], [place], [address] [phone]  [facility]  Locations in [place], [place], [place],  [place].  [phone] Accepts all insurances and some uninsured.  [facility] Treatment [phone] Ages 65-64. Accepts most insurances and some uninsured.    Residential Treatment Programs  [facility] ([facility] [address] [place], [place] [address] [phone] or [phone] Detox and Residential Rehab 21 days (Medicaid, private insurance, and self pay. If Medicare, will look into funding). No methadone. Call for pre-screen.   [facility]) [address]  [place], [place] [address] [phone] Detox 3-7 days (self Pay and Medicaid Limited). Transitional Program for females needs 60 days clean first.  Rehab Only for Males 60 days (Medicare, and Self Pay)-No methadone.  [facility] [address] [place], [place] [address] [phone] or [phone] Private Insurance only  [facility] [place] [address] [place], [place] [address] Phone:  [phone] Must be detoxed 72 hours prior to admission; 28 day program.  Self-pay.  [facility] [address]  [place], [place] [phone] [employer], Medicare, [place] (not straight [place]). They offer assistance with transportation.   [facility] [address] [place],  [place], [place] [address] [phone] Christian Based Program. Men only. No insurance  [facility] [address] [place], [place] [address] Men's: [phone] No Medicaid. No Methadone.  [facility]  [address] [place]  [place], [place] [address] [phone] Treatment Only, must make assessment appointment, and must be sober for assessment appointment. Self pay, [facility], must be [facility] resident. No methadone.   [facility]  [address] [place], [place] [address] [phone] No pending legal charges, Long-term work program. No methadone. Call for assessment.  [facility]  [address], [place], [place] [address] [phone] or [phone] Commercial Insurance Only  [facility] - [phone] Private Insurance (no [place]). Males/Females, call to make referrals, multiple facilities.   [facility] [address]  [place], [place] [address]  [phone] Men Only Upfront Fee Must be able to climb stairs. No benzos or narcotics.  [facility]-no methadone: [facility] [address] [place], [place] [address] [phone] (f)  [facility] Locations across the [place] (mainly [place] ) willing to help with transportation.  [phone] [employer].     AA Meetings Meeting Locator:  [email or web address] Also can download an app on that website.  Syringe Services Program Due to COVID-19, syringe services programs are likely operating under different hours with limited or no fixed site hours. Some programs may not be operating at all. Please contact the program directly using the phone numbers provided below to see if they are still operating under COVID-19. [facility] Solution to the Opioid Problem (GCSTOP) Fixed; mobile; peer-based [place] [phone] [email or web address] Fixed site exchange at [facility], [address] [place], [place] [address] on Wednesdays (2:00 - 5:00 pm) and Thursdays (4:00 - 8:00 pm). Pop-up mobile exchange locations: [employer], [address] [place] [address] [place], [place] [address] on Tuesdays (11:00 am - 1:00 pm) and Fridays (11:00 am - 1:00 pm) -[facility] - [address], [place], [place] [address] on Tuesdays (2:00 - 4:00 pm) and Fridays (2:00 - 4:00 pm) -[facility] -Fixed; mobile; peer-based [phone] [address], [place], [place] [address] Delivery and outreach available in [place]  and [place], please call for more information. Monday, Tuesday: 1:00 -7:00 pm, Thursday: 4:00 pm - 8:00 pm, Friday: 1:00 pm - 8:00 pm) Medication-Assisted Treatment (MAT) -[facility]: Locations in [place], [place], [place], [place]. Accepts all insurances and some uninsured. Methadone, Suboxone, etc. Closed on weekends. Walk ins before 11am.  Call [phone]  -[facility]- services [place] and surrounding areas including [place], [place], [place], [place], [address], [address], [place], [place], [place], and [place], [place]. Options include Methadone, buprenorphine or Suboxone. [address] [place], [place] [address] Phone: [phone] [place] - Fri: 5:30am - 2:00pm, Sat: 5:30am -7:30am, Sun: Closed  -[facility] [place]- We use FDA-approved medications, like methadone/suboxone/sublocade, and vivitrol. These medications are then combined with customized care plans that include individual or group counseling, toxicology, and medical care directed by on-site physicians. Accepts most insurance plans, Medicaid, and private pay.  [address] [place], [place] [address] Phone: [phone] Monday-Friday 5:00 AM - 10:00 AM, Saturday 6:00 AM - 8:30 AM, Sunday 6:00 AM - 7:00 AM  -[facility]- [facility] is a treatment & recovery focused program. In addition to receiving methadone medication, our clients participate in individual and group counseling as well as random drug testing. If accepted into the [facility] Opioid Program, you will be provided several intake appointments and a physical exam [address] [place], [place] [address] Office: [phone]  Fax: [phone]  -[facility]- We put our community members at the center of everything we do, for remote treatment services as well as in-person, from alcohol withdrawal to opioid use and more.  [address], [place], [place]  [address] [phone] Monday-Wednesday: 9:00am - 5:00pm, Thursday: 9:00am - 6:00pm, Friday: 9:00am - 5:00pm Saturday: 9:00am - 1:00pm, Sunday: Closed  -[facility] [place]- Our clinic in [place], a medication unit, offers daily dosing of methadone or buprenorphine in addition to our clinic in [place] offering counseling to help people overcome addiction to heroin and other opiates. We also offer psychiatric services including medication management, and an office based suboxone program. [address], [place], [place] [address] Phone [phone]  [facility] Internal Medicine-We treat Opioid Addiction using medications that are a combination of buprenorphine and naloxone, which are used to treat adults addicted to narcotic painkillers and drugs such as heroin. They reduce the intense cravings and painful symptoms that accompany withdrawal. [address] [place], [place] Phone: [phone]  -[facility] (Or [place]): $12/daily for Methadone Treatment.  [address] [place], [place] [address] [phone]  [place] [phone] [address] [place], [place] [address]  -[facility]: Suboxone only.  [address], [place], [place] [address] P. [phone] -[facility] [address] [place], [place] [address] P. [phone]
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
## 2024-12-16 ENCOUNTER — Other Ambulatory Visit (HOSPITAL_COMMUNITY): Payer: Self-pay

## 2024-12-16 LAB — CBC
HCT: 34.4 % — ABNORMAL LOW (ref 39.0–52.0)
Hemoglobin: 11.2 g/dL — ABNORMAL LOW (ref 13.0–17.0)
MCH: 29.2 pg (ref 26.0–34.0)
MCHC: 32.6 g/dL (ref 30.0–36.0)
MCV: 89.6 fL (ref 80.0–100.0)
Platelets: 222 K/uL (ref 150–400)
RBC: 3.84 MIL/uL — ABNORMAL LOW (ref 4.22–5.81)
RDW: 14.6 % (ref 11.5–15.5)
WBC: 9.5 K/uL (ref 4.0–10.5)
nRBC: 0 % (ref 0.0–0.2)

## 2024-12-16 LAB — BASIC METABOLIC PANEL WITH GFR
Anion gap: 8 (ref 5–15)
BUN: 11 mg/dL (ref 8–23)
CO2: 28 mmol/L (ref 22–32)
Calcium: 8.4 mg/dL — ABNORMAL LOW (ref 8.9–10.3)
Chloride: 102 mmol/L (ref 98–111)
Creatinine, Ser: 1.15 mg/dL (ref 0.61–1.24)
GFR, Estimated: >60 mL/min
Glucose, Bld: 101 mg/dL — ABNORMAL HIGH (ref 70–99)
Potassium: 4.1 mmol/L (ref 3.5–5.1)
Sodium: 137 mmol/L (ref 135–145)

## 2024-12-16 MED ORDER — OXYCODONE HCL 10 MG PO TABS
10.0000 mg | ORAL_TABLET | Freq: Four times a day (QID) | ORAL | 0 refills | Status: AC | PRN
  Filled 2024-12-16: qty 20, 5d supply, fill #0

## 2024-12-16 MED ORDER — ACETAMINOPHEN 500 MG PO TABS
1000.0000 mg | ORAL_TABLET | Freq: Four times a day (QID) | ORAL | 0 refills | Status: AC
  Filled 2024-12-16: qty 30, 4d supply, fill #0

## 2024-12-16 MED ORDER — METHOCARBAMOL 500 MG PO TABS
500.0000 mg | ORAL_TABLET | Freq: Four times a day (QID) | ORAL | 0 refills | Status: AC | PRN
  Filled 2024-12-16: qty 40, 10d supply, fill #0

## 2024-12-16 MED ORDER — LIDOCAINE 5 % EX PTCH
2.0000 | MEDICATED_PATCH | CUTANEOUS | 0 refills | Status: AC
  Filled 2024-12-16: qty 30, 15d supply, fill #0

## 2024-12-16 NOTE — TOC Transition Note (Signed)
 Transition of Care [facility]) - Discharge Note   Patient Details  Name: Max Gomez MRN: [id number] Date of Birth: 15-Apr-1961  [facility]) CM/SW Contact:  Max Diniz E Sharona Rovner, LCSW Phone Number: 12/16/2024, 2:40 PM   Clinical Narrative:    Patient medically cleared to DC home with son today. Patient states he does not have a ride/money for a ride. Cab voucher provided -verified home address with son Narvis (per patient request) and updated address in chart [address] [place] [address]. [place] Waiver signed by patient and placed on hard chart.    Final next level of care: Home/Self Care Barriers to Discharge: Barriers Resolved   Patient Goals and CMS Choice   CMS Medicare.gov Compare Post Acute Care list provided to:: Patient Choice offered to / list presented to : Patient      Discharge Placement                Patient to be transferred to facility by: [facility] Name of family member notified: Narvis - son Patient and family notified of of transfer: 12/16/24  Discharge Plan and Services Additional resources added to the After Visit Summary for                                       Social Drivers of Health (SDOH) Interventions SDOH Screenings   Food Insecurity: No Food Insecurity (12/15/2024)  Housing: Low Risk (12/15/2024)  Transportation Needs: No Transportation Needs (12/15/2024)  Utilities: Not At Risk (12/15/2024)     Readmission Risk Interventions     No data to display
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
## 2024-12-16 NOTE — Plan of Care (Signed)

## 2024-12-31 ENCOUNTER — Other Ambulatory Visit (HOSPITAL_COMMUNITY): Payer: Self-pay | Admitting: Surgery

## 2024-12-31 DIAGNOSIS — S2242XA Multiple fractures of ribs, left side, initial encounter for closed fracture: Secondary | ICD-10-CM

## 2025-01-12 ENCOUNTER — Ambulatory Visit (HOSPITAL_COMMUNITY)
Admission: RE | Admit: 2025-01-12 | Discharge: 2025-01-12 | Disposition: A | Source: Ambulatory Visit | Attending: Surgery

## 2025-01-12 DIAGNOSIS — S2242XA Multiple fractures of ribs, left side, initial encounter for closed fracture: Secondary | ICD-10-CM

## 2025-05-09 ENCOUNTER — Ambulatory Visit (INDEPENDENT_AMBULATORY_CARE_PROVIDER_SITE_OTHER): Admitting: Primary Care
